# Patient Record
Sex: Female | Born: 1995 | Race: Black or African American | Hispanic: No | Smoking: Never smoker
Health system: Southern US, Community
[De-identification: ages and names within clinical notes are randomized; demographics above are authoritative.]

## PROBLEM LIST (undated history)

## (undated) DIAGNOSIS — Z973 Presence of spectacles and contact lenses: Secondary | ICD-10-CM

## (undated) DIAGNOSIS — J45909 Unspecified asthma, uncomplicated: Secondary | ICD-10-CM

## (undated) DIAGNOSIS — N840 Polyp of corpus uteri: Secondary | ICD-10-CM

---
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
## 2016-03-09 ENCOUNTER — Encounter (HOSPITAL_COMMUNITY): Payer: Self-pay | Admitting: Pharmacy Technician

## 2016-03-09 ENCOUNTER — Observation Stay (HOSPITAL_COMMUNITY)
Admission: EM | Admit: 2016-03-09 | Discharge: 2016-03-12 | Disposition: A | Discharge status: Home / Self Care | Payer: Federal, State, Local not specified - PPO | Attending: General Surgery | Admitting: General Surgery

## 2016-03-09 ENCOUNTER — Emergency Department (HOSPITAL_COMMUNITY): Payer: Federal, State, Local not specified - PPO

## 2016-03-09 DIAGNOSIS — Z882 Allergy status to sulfonamides status: Secondary | ICD-10-CM | POA: Diagnosis not present

## 2016-03-09 DIAGNOSIS — J45909 Unspecified asthma, uncomplicated: Secondary | ICD-10-CM | POA: Diagnosis not present

## 2016-03-09 DIAGNOSIS — K3580 Unspecified acute appendicitis: Secondary | ICD-10-CM | POA: Diagnosis present

## 2016-03-09 DIAGNOSIS — R109 Unspecified abdominal pain: Secondary | ICD-10-CM

## 2016-03-09 HISTORY — DX: Unspecified asthma, uncomplicated: J45.909

## 2016-03-09 LAB — CBC
HEMATOCRIT: 35.7 % — AB (ref 36.0–46.0)
Hemoglobin: 11.9 g/dL — ABNORMAL LOW (ref 12.0–15.0)
MCH: 26.3 pg (ref 26.0–34.0)
MCHC: 33.3 g/dL (ref 30.0–36.0)
MCV: 79 fL (ref 78.0–100.0)
Platelets: 407 10*3/uL — ABNORMAL HIGH (ref 150–400)
RBC: 4.52 MIL/uL (ref 3.87–5.11)
RDW: 15 % (ref 11.5–15.5)
WBC: 16 10*3/uL — ABNORMAL HIGH (ref 4.0–10.5)

## 2016-03-09 LAB — DIFFERENTIAL
BASOS ABS: 0 10*3/uL (ref 0.0–0.1)
BASOS PCT: 0 %
Eosinophils Absolute: 0 10*3/uL (ref 0.0–0.7)
Eosinophils Relative: 0 %
Lymphocytes Relative: 10 %
Lymphs Abs: 1.6 10*3/uL (ref 0.7–4.0)
MONO ABS: 0.9 10*3/uL (ref 0.1–1.0)
Monocytes Relative: 6 %
NEUTROS ABS: 13.3 10*3/uL — AB (ref 1.7–7.7)
NEUTROS PCT: 84 %

## 2016-03-09 LAB — COMPREHENSIVE METABOLIC PANEL
ALBUMIN: 3.4 g/dL — AB (ref 3.5–5.0)
ALT: 12 U/L — AB (ref 14–54)
AST: 24 U/L (ref 15–41)
Alkaline Phosphatase: 55 U/L (ref 38–126)
Anion gap: 8 (ref 5–15)
BUN: 5 mg/dL — AB (ref 6–20)
CHLORIDE: 105 mmol/L (ref 101–111)
CO2: 23 mmol/L (ref 22–32)
Calcium: 9.4 mg/dL (ref 8.9–10.3)
Creatinine, Ser: 1.1 mg/dL — ABNORMAL HIGH (ref 0.44–1.00)
GFR calc Af Amer: >60 mL/min (ref >60)
GFR calc non Af Amer: >60 mL/min (ref >60)
GLUCOSE: 94 mg/dL (ref 65–99)
POTASSIUM: 4.3 mmol/L (ref 3.5–5.1)
Sodium: 136 mmol/L (ref 135–145)
Total Bilirubin: 0.3 mg/dL (ref 0.3–1.2)
Total Protein: 7.9 g/dL (ref 6.5–8.1)

## 2016-03-09 LAB — I-STAT BETA HCG BLOOD, ED (MC, WL, AP ONLY): I-stat hCG, quantitative: <5 m[IU]/mL (ref <5)

## 2016-03-09 LAB — WET PREP, GENITAL
Clue Cells Wet Prep HPF POC: NONE SEEN
SPERM: NONE SEEN
Trich, Wet Prep: NONE SEEN
Yeast Wet Prep HPF POC: NONE SEEN

## 2016-03-09 LAB — LIPASE, BLOOD: LIPASE: 32 U/L (ref 11–51)

## 2016-03-09 MED ORDER — ONDANSETRON 4 MG PO TBDP
ORAL_TABLET | ORAL | Status: AC
  Filled 2016-03-09: qty 1

## 2016-03-09 MED ORDER — AZITHROMYCIN 250 MG PO TABS
1000.0000 mg | ORAL_TABLET | Freq: Once | ORAL | Status: AC
  Administered 2016-03-10: 1000 mg via ORAL
  Filled 2016-03-09: qty 4

## 2016-03-09 MED ORDER — SODIUM CHLORIDE 0.9 % IV BOLUS (SEPSIS)
1000.0000 mL | Freq: Once | INTRAVENOUS | Status: AC
  Administered 2016-03-09: 1000 mL via INTRAVENOUS

## 2016-03-09 MED ORDER — CEFTRIAXONE SODIUM 250 MG IJ SOLR
250.0000 mg | Freq: Once | INTRAMUSCULAR | Status: AC
  Administered 2016-03-10: 250 mg via INTRAMUSCULAR
  Filled 2016-03-09: qty 250

## 2016-03-09 MED ORDER — IOPAMIDOL (ISOVUE-300) INJECTION 61%
INTRAVENOUS | Status: AC
  Administered 2016-03-09: 100 mL
  Filled 2016-03-09: qty 100

## 2016-03-09 MED ORDER — ONDANSETRON 4 MG PO TBDP
4.0000 mg | ORAL_TABLET | Freq: Once | ORAL | Status: AC | PRN
  Administered 2016-03-09: 4 mg via ORAL

## 2016-03-09 NOTE — H&P (Signed)
Robin Floyd is an 20 y.o. female.   Chief Complaint: Right lower quadrant abdominal pain, nausea and vomiting HPI: Robin Floyd developed central abdominal pain 2 days ago. It persisted and got worse by earlier today. She had an episode of vomiting and the pain more localized to her right lower quadrant. She was evaluated in the emergency department where workup revealed leukocytosis of 16,000. CT scan abdomen and pelvis demonstrates acute appendicitis. Additionally, the emergency department is treating her for GC/chlamydia.   Past Medical History:  Diagnosis Date  . Asthma     History reviewed. No pertinent surgical history.  History reviewed. No pertinent family history. Social History:  reports that she has never smoked. She has never used smokeless tobacco. She reports that she does not drink alcohol or use drugs.  Allergies:  Allergies  Allergen Reactions  . Sulfa Antibiotics Hives     (Not in a hospital admission)  Results for orders placed or performed during the hospital encounter of 03/09/16 (from the past 48 hour(s))  Lipase, blood     Status: None   Collection Time: 03/09/16  4:09 PM  Result Value Ref Range   Lipase 32 11 - 51 U/L  Comprehensive metabolic panel     Status: Abnormal   Collection Time: 03/09/16  4:09 PM  Result Value Ref Range   Sodium 136 135 - 145 mmol/L   Potassium 4.3 3.5 - 5.1 mmol/L   Chloride 105 101 - 111 mmol/L   CO2 23 22 - 32 mmol/L   Glucose, Bld 94 65 - 99 mg/dL   BUN 5 (L) 6 - 20 mg/dL   Creatinine, Ser 1.10 (H) 0.44 - 1.00 mg/dL   Calcium 9.4 8.9 - 10.3 mg/dL   Total Protein 7.9 6.5 - 8.1 g/dL   Albumin 3.4 (L) 3.5 - 5.0 g/dL   AST 24 15 - 41 U/L   ALT 12 (L) 14 - 54 U/L   Alkaline Phosphatase 55 38 - 126 U/L   Total Bilirubin 0.3 0.3 - 1.2 mg/dL   GFR calc non Af Amer >60 >60 mL/min   GFR calc Af Amer >60 >60 mL/min    Comment: (NOTE) The eGFR has been calculated using the CKD EPI equation. This calculation has not been validated in  all clinical situations. eGFR's persistently <60 mL/min signify possible Chronic Kidney Disease.    Anion gap 8 5 - 15  CBC     Status: Abnormal   Collection Time: 03/09/16  4:09 PM  Result Value Ref Range   WBC 16.0 (H) 4.0 - 10.5 K/uL   RBC 4.52 3.87 - 5.11 MIL/uL   Hemoglobin 11.9 (L) 12.0 - 15.0 g/dL   HCT 35.7 (L) 36.0 - 46.0 %   MCV 79.0 78.0 - 100.0 fL   MCH 26.3 26.0 - 34.0 pg   MCHC 33.3 30.0 - 36.0 g/dL   RDW 15.0 11.5 - 15.5 %   Platelets 407 (H) 150 - 400 K/uL  Differential     Status: Abnormal   Collection Time: 03/09/16  4:09 PM  Result Value Ref Range   Neutrophils Relative % 84 %   Neutro Abs 13.3 (H) 1.7 - 7.7 K/uL   Lymphocytes Relative 10 %   Lymphs Abs 1.6 0.7 - 4.0 K/uL   Monocytes Relative 6 %   Monocytes Absolute 0.9 0.1 - 1.0 K/uL   Eosinophils Relative 0 %   Eosinophils Absolute 0.0 0.0 - 0.7 K/uL   Basophils Relative 0 %  Basophils Absolute 0.0 0.0 - 0.1 K/uL  I-Stat beta hCG blood, ED     Status: None   Collection Time: 03/09/16  4:31 PM  Result Value Ref Range   I-stat hCG, quantitative <5.0 <5 mIU/mL   Comment 3            Comment:   GEST. AGE      CONC.  (mIU/mL)   <=1 WEEK        5 - 50     2 WEEKS       50 - 500     3 WEEKS       100 - 10,000     4 WEEKS     1,000 - 30,000        FEMALE AND NON-PREGNANT FEMALE:     LESS THAN 5 mIU/mL   Wet prep, genital     Status: Abnormal   Collection Time: 03/09/16  6:55 PM  Result Value Ref Range   Yeast Wet Prep HPF POC NONE SEEN NONE SEEN    Comment: Specimen diluted due to transport tube containing more than 1 ml of saline, interpret results with caution.   Trich, Wet Prep NONE SEEN NONE SEEN   Clue Cells Wet Prep HPF POC NONE SEEN NONE SEEN   WBC, Wet Prep HPF POC MANY (A) NONE SEEN   Sperm NONE SEEN    Ct Abdomen Pelvis W Contrast  Result Date: 03/09/2016 CLINICAL DATA:  Severe right lower quadrant pain EXAM: CT ABDOMEN AND PELVIS WITH CONTRAST TECHNIQUE: Multidetector CT imaging of the  abdomen and pelvis was performed using the standard protocol following bolus administration of intravenous contrast. CONTRAST:  100 cc Isovue 300 intravenous COMPARISON:  None. FINDINGS: Lower chest: Visualized lung bases show no acute consolidation or pleural effusion. Hepatobiliary: No focal liver abnormality is seen. No gallstones, gallbladder wall thickening, or biliary dilatation. Pancreas: Unremarkable. No pancreatic ductal dilatation or surrounding inflammatory changes. Spleen: Normal in size without focal abnormality. Adrenals/Urinary Tract: Adrenal glands are unremarkable. Kidneys are normal, without renal calculi, focal lesion, or hydronephrosis. Bladder is unremarkable. Stomach/Bowel: Stomach is nonenlarged. No dilated small bowel to suggest bowel obstruction. Best seen on coronal views is a dilated tubular structure in the right lower quadrant measuring up to 1 cm in diameter, this is suspected to represent enlarged appendix. No perforation. No abscess. Small fluid in the right lower quadrant. Vascular/Lymphatic: No significant vascular findings are present. No enlarged abdominal or pelvic lymph nodes. Reproductive: Mildly prominent endometrial stripe. No adnexal masses. Other: Small free fluid in the pelvis. No free air. Edema within the pelvic fat. Musculoskeletal: No acute or significant osseous findings. IMPRESSION: 1. Findings are suspicious for acute appendicitis. There is no evidence for perforation or abscess. 2. Small free fluid in the pelvis. Electronically Signed   By: Donavan Foil M.D.   On: 03/09/2016 21:55    Review of Systems  Constitutional: Positive for malaise/fatigue. Negative for fever.  HENT: Negative.   Eyes: Negative.   Respiratory: Negative for cough and shortness of breath.   Cardiovascular: Negative for chest pain.  Gastrointestinal: Positive for abdominal pain and nausea.  Genitourinary: Negative.   Musculoskeletal: Negative.   Skin: Negative.   Neurological:  Negative.   Endo/Heme/Allergies: Negative.   Psychiatric/Behavioral: Negative.     Blood pressure 129/91, pulse 87, temperature 97.7 F (36.5 C), temperature source Oral, resp. rate 14, height '5\' 6"'  (1.676 m), weight 59 kg (130 lb), last menstrual period 02/07/2016, SpO2 100 %.  Physical Exam  Constitutional: She is oriented to person, place, and time. She appears well-developed and well-nourished.  HENT:  Head: Normocephalic.  Right Ear: External ear normal.  Left Ear: External ear normal.  Mouth/Throat: Oropharynx is clear and moist.  Eyes: Conjunctivae and EOM are normal. Pupils are equal, round, and reactive to light.  Neck: Neck supple. No thyromegaly present.  Cardiovascular: Normal rate, normal heart sounds and intact distal pulses.   Respiratory: Effort normal and breath sounds normal. No stridor. No respiratory distress. She has no wheezes. She has no rales.  GI: She exhibits no distension. There is tenderness. There is no rebound and no guarding.  Musculoskeletal: Normal range of motion. She exhibits no edema.  Neurological: She is alert and oriented to person, place, and time. She exhibits normal muscle tone.  Skin: Skin is warm.  Psychiatric: She has a normal mood and affect.     Assessment/Plan Acute appendicitis - admit, IV antibiotics, plan laparoscopic appendectomy. I discussed the procedure, risks, and benefits with her. She wants to hold off on surgery until her family arrives from out of town. They will be here in the morning. I also spoke with her mother on the telephone per her request. I discussed the expected postoperative course and answered their questions. I will discuss with Dr. Brantley Stage in the morning.  GC/Chlamydia infection - this is being treated in the emergency department.  Zenovia Jarred, MD 03/09/2016, 11:41 PM

## 2016-03-09 NOTE — ED Provider Notes (Signed)
[facility] DEPT Provider Note   CSN: [id number] Arrival date & time: 03/09/16  1602     History   Chief Complaint Chief Complaint  Patient presents with  . Vaginal Discharge  . Abdominal Pain    HPI Robin Floyd is a 20 y.o. female.  HPI   Patient is a 20 year old female with no significant past medical history who presents to the emergency department with abdominal pain for 2 days. Pain is constant, sharp/cramping in nature, nonradiating, began around her bellybutton and has moved into her right lower quadrant just today. Pt has not taken anything for this. Associated diarrhea, nausea, vomiting, chills. Patient denies fever, dizziness, syncope, recent illness, hematochezia, hematemesis.  Patient is also complaining of a foul vaginal odor after intercourse. Patient denies vaginal discharge. Seen by her OB/GYN in July for BV and she feels symptoms are similar. Patient denies abnormal vaginal bleeding. Patient has sex with just men, one partner. She has unprotected sex. No history of STDs.  Past Medical History:  Diagnosis Date  . Asthma     Patient Active Problem List   Diagnosis Date Noted  . Acute appendicitis 03/09/2016    History reviewed. No pertinent surgical history.  OB History    No data available       Home Medications    Prior to Admission medications   Medication Sig Start Date End Date Taking? Authorizing Provider  levonorgestrel-ethinyl estradiol (JOLESSA) 0.15-0.03 MG tablet Take 1 tablet by mouth daily.   Yes Historical Provider, MD    Family History History reviewed. No pertinent family history.  Social History Social History  Substance Use Topics  . Smoking status: Never Smoker  . Smokeless tobacco: Never Used  . Alcohol use No     Allergies   Sulfa antibiotics   Review of Systems Review of Systems  Constitutional: Positive for fever.  Cardiovascular: Negative for chest pain.  Gastrointestinal: Positive for abdominal pain,  diarrhea, nausea and vomiting. Negative for abdominal distention, blood in stool and constipation.  Genitourinary: Negative for dysuria, hematuria and vaginal pain.       Vaginal odor  Skin: Negative for rash.  Neurological: Negative for dizziness, syncope, weakness and headaches.  All other systems reviewed and are negative.    Physical Exam Updated Vital Signs BP 129/91 (BP Location: Left Arm)   Pulse 87   Temp 97.7 F (36.5 C) (Oral)   Resp 14   Ht 5\' 6"  (1.676 m)   Wt 59 kg   LMP 02/07/2016 (Approximate)   SpO2 100%   BMI 20.98 kg/m   Physical Exam  Constitutional: She appears well-developed and well-nourished. No distress.  HENT:  Head: Normocephalic and atraumatic.  Eyes: Conjunctivae are normal.  Neck: Normal range of motion.  Cardiovascular: Normal rate, regular rhythm, normal heart sounds and intact distal pulses.  Exam reveals no gallop and no friction rub.   No murmur heard. Pulses:      Radial pulses are 2+ on the right side, and 2+ on the left side.       Dorsalis pedis pulses are 2+ on the right side, and 2+ on the left side.  Pulmonary/Chest: Effort normal and breath sounds normal. No respiratory distress. She has no decreased breath sounds. She has no wheezes. She has no rhonchi. She has no rales. She exhibits no tenderness.  Abdominal: Soft. Normal appearance and bowel sounds are normal. There is tenderness in the right lower quadrant and suprapubic area. There is tenderness at McBurney's point. There  is no rigidity, no rebound, no guarding, no CVA tenderness and negative Murphy's sign.  Genitourinary:  Genitourinary Comments: Exam performed by Jerre SimonJessica L Focht,  exam chaperoned Date: 03/09/2016 Pelvic exam: normal external genitalia without evidence of trauma. VULVA: normal appearing vulva with no masses, tenderness or lesion. VAGINA: normal appearing vagina with normal color and discharge, no lesions. CERVIX: mild cervical motion tenderness absent, cervical  os closed with purulent discharge , os erythematous and friable appearing; vaginal discharge - copious, creamy, mucoid and thick, Wet prep and DNA probe for chlamydia and GC obtained.   ADNEXA: normal adnexa in size, nontender and no masses UTERUS: uterus is normal size, shape, consistency and nontender.    Musculoskeletal: Normal range of motion.  Neurological: She is alert. Coordination normal.  Skin: Skin is warm and dry. She is not diaphoretic.  Psychiatric: She has a normal mood and affect. Her behavior is normal.  Nursing note and vitals reviewed.    ED Treatments / Results  Labs (all labs ordered are listed, but only abnormal results are displayed) Labs Reviewed  WET PREP, GENITAL - Abnormal; Notable for the following:       Result Value   WBC, Wet Prep HPF POC MANY (*)    All other components within normal limits  COMPREHENSIVE METABOLIC PANEL - Abnormal; Notable for the following:    BUN 5 (*)    Creatinine, Ser 1.10 (*)    Albumin 3.4 (*)    ALT 12 (*)    All other components within normal limits  CBC - Abnormal; Notable for the following:    WBC 16.0 (*)    Hemoglobin 11.9 (*)    HCT 35.7 (*)    Platelets 407 (*)    All other components within normal limits  DIFFERENTIAL - Abnormal; Notable for the following:    Neutro Abs 13.3 (*)    All other components within normal limits  LIPASE, BLOOD  RPR  HIV ANTIBODY (ROUTINE TESTING)  URINALYSIS, ROUTINE W REFLEX MICROSCOPIC (NOT AT [facility])  I-STAT BETA HCG BLOOD, ED ([facility] ONLY)  GC/CHLAMYDIA PROBE AMP ([place]) NOT AT [facility]    EKG  EKG Interpretation None       Radiology Ct Abdomen Pelvis W Contrast  Result Date: 03/09/2016 CLINICAL DATA:  Severe right lower quadrant pain EXAM: CT ABDOMEN AND PELVIS WITH CONTRAST TECHNIQUE: Multidetector CT imaging of the abdomen and pelvis was performed using the standard protocol following bolus administration of intravenous contrast. CONTRAST:  100 cc Isovue 300  intravenous COMPARISON:  None. FINDINGS: Lower chest: Visualized lung bases show no acute consolidation or pleural effusion. Hepatobiliary: No focal liver abnormality is seen. No gallstones, gallbladder wall thickening, or biliary dilatation. Pancreas: Unremarkable. No pancreatic ductal dilatation or surrounding inflammatory changes. Spleen: Normal in size without focal abnormality. Adrenals/Urinary Tract: Adrenal glands are unremarkable. Kidneys are normal, without renal calculi, focal lesion, or hydronephrosis. Bladder is unremarkable. Stomach/Bowel: Stomach is nonenlarged. No dilated small bowel to suggest bowel obstruction. Best seen on coronal views is a dilated tubular structure in the right lower quadrant measuring up to 1 cm in diameter, this is suspected to represent enlarged appendix. No perforation. No abscess. Small fluid in the right lower quadrant. Vascular/Lymphatic: No significant vascular findings are present. No enlarged abdominal or pelvic lymph nodes. Reproductive: Mildly prominent endometrial stripe. No adnexal masses. Other: Small free fluid in the pelvis. No free air. Edema within the pelvic fat. Musculoskeletal: No acute or significant osseous findings. IMPRESSION: 1.  Findings are suspicious for acute appendicitis. There is no evidence for perforation or abscess. 2. Small free fluid in the pelvis. Electronically Signed   By: Jasmine PangKim  Fujinaga M.D.   On: 03/09/2016 21:55    Procedures Procedures (including critical care time)  Medications Ordered in ED Medications  cefTRIAXone (ROCEPHIN) injection 250 mg (not administered)  azithromycin (ZITHROMAX) tablet 1,000 mg (not administered)  lidocaine (PF) (XYLOCAINE) 1 % injection (not administered)  ondansetron (ZOFRAN-ODT) disintegrating tablet 4 mg (4 mg Oral Given 03/09/16 1616)  sodium chloride 0.9 % bolus 1,000 mL (0 mLs Intravenous Stopped 03/09/16 2130)  iopamidol (ISOVUE-300) 61 % injection (100 mLs  Contrast Given 03/09/16 2119)    sodium chloride 0.9 % bolus 1,000 mL (1,000 mLs Intravenous New Bag/Given 03/09/16 2328)     Initial Impression / Assessment and Plan / ED Course  I have reviewed the triage vital signs and the nursing notes.  Pertinent labs & imaging results that were available during my care of the patient were reviewed by me and considered in my medical decision making (see chart for details).  Clinical Course    Patient treated in the ED for STI with Azithromycin and Rocephin. Patient advised to inform and treat all sexual partners.  Pt advised on safe sex practices and understands that they have GC/Chlamydia cultures pending and will result in 2-3 days. HIV and RPR sent. Pt encouraged to follow up at local health department for future STI checks.   Patient also found to have leukocytosis. PT with appendicitis on CT abdomen. The Gen. surgery team was consulted and patient will be admitted for appendectomy.   Thank you Dr. Janee Mornhompson for your consult, time and care of this patient.   Pt case discussed with Dr. Eudelia Bunchardama who agrees with the above plan.    Final Clinical Impressions(s) / ED Diagnoses   Final diagnoses:  Abdominal pain, unspecified abdominal location  Acute appendicitis, unspecified acute appendicitis type    New Prescriptions New Prescriptions   No medications on file     Jerre SimonJessica L Focht, PA 03/10/16 0022    Nira ConnPedro Eduardo Cardama, MD 03/10/16 [phone]

## 2016-03-09 NOTE — ED Triage Notes (Signed)
Pt reports to the ED with reports of vaginal odor since Friday when she last had intercourse. Pt also c/o N/V/D X 2 days. Reports vomiting X1 in the last 24 hours.

## 2016-03-10 ENCOUNTER — Observation Stay (HOSPITAL_COMMUNITY): Payer: Federal, State, Local not specified - PPO | Admitting: Anesthesiology

## 2016-03-10 ENCOUNTER — Encounter (HOSPITAL_COMMUNITY)
Admission: EM | Disposition: A | Discharge status: Home / Self Care | Payer: Self-pay | Source: Home / Self Care | Attending: Emergency Medicine

## 2016-03-10 HISTORY — PX: LAPAROSCOPIC APPENDECTOMY: SHX408

## 2016-03-10 LAB — URINALYSIS, ROUTINE W REFLEX MICROSCOPIC
Bilirubin Urine: NEGATIVE
Glucose, UA: NEGATIVE mg/dL
Hgb urine dipstick: NEGATIVE
KETONES UR: 15 mg/dL — AB
LEUKOCYTES UA: NEGATIVE
NITRITE: NEGATIVE
PH: 5.5 (ref 5.0–8.0)
PROTEIN: NEGATIVE mg/dL
Specific Gravity, Urine: 1.017 (ref 1.005–1.030)

## 2016-03-10 LAB — GC/CHLAMYDIA PROBE AMP (~~LOC~~) NOT AT ARMC
Chlamydia: POSITIVE — AB
Neisseria Gonorrhea: NEGATIVE

## 2016-03-10 LAB — SURGICAL PCR SCREEN
MRSA, PCR: NEGATIVE
STAPHYLOCOCCUS AUREUS: NEGATIVE

## 2016-03-10 LAB — HIV ANTIBODY (ROUTINE TESTING W REFLEX): HIV Screen 4th Generation wRfx: NONREACTIVE

## 2016-03-10 LAB — RPR: RPR: NONREACTIVE

## 2016-03-10 SURGERY — APPENDECTOMY, LAPAROSCOPIC
Anesthesia: General | Site: Abdomen

## 2016-03-10 MED ORDER — PROPOFOL 10 MG/ML IV BOLUS
INTRAVENOUS | Status: AC
  Filled 2016-03-10: qty 20

## 2016-03-10 MED ORDER — MORPHINE SULFATE (PF) 2 MG/ML IV SOLN
2.0000 mg | INTRAVENOUS | Status: DC | PRN
  Administered 2016-03-10 (×2): 2 mg via INTRAVENOUS
  Administered 2016-03-10: 4 mg via INTRAVENOUS
  Administered 2016-03-10: 2 mg via INTRAVENOUS
  Administered 2016-03-10 – 2016-03-11 (×3): 4 mg via INTRAVENOUS
  Filled 2016-03-10: qty 1
  Filled 2016-03-10 (×2): qty 2
  Filled 2016-03-10: qty 1
  Filled 2016-03-10 (×2): qty 2
  Filled 2016-03-10: qty 1

## 2016-03-10 MED ORDER — PIPERACILLIN-TAZOBACTAM 3.375 G IVPB
3.3750 g | Freq: Three times a day (TID) | INTRAVENOUS | Status: DC
  Administered 2016-03-10 – 2016-03-12 (×7): 3.375 g via INTRAVENOUS
  Filled 2016-03-10 (×9): qty 50

## 2016-03-10 MED ORDER — ONDANSETRON HCL 4 MG/2ML IJ SOLN
INTRAMUSCULAR | Status: DC | PRN
  Administered 2016-03-10: 4 mg via INTRAVENOUS

## 2016-03-10 MED ORDER — MIDAZOLAM HCL 2 MG/2ML IJ SOLN
INTRAMUSCULAR | Status: AC
  Filled 2016-03-10: qty 2

## 2016-03-10 MED ORDER — MIDAZOLAM HCL 5 MG/5ML IJ SOLN
INTRAMUSCULAR | Status: DC | PRN
  Administered 2016-03-10: 2 mg via INTRAVENOUS

## 2016-03-10 MED ORDER — ONDANSETRON HCL 4 MG/2ML IJ SOLN
4.0000 mg | Freq: Four times a day (QID) | INTRAMUSCULAR | Status: DC | PRN
  Filled 2016-03-10: qty 2

## 2016-03-10 MED ORDER — BUPIVACAINE HCL 0.25 % IJ SOLN
INTRAMUSCULAR | Status: DC | PRN
  Administered 2016-03-10: 4 mL

## 2016-03-10 MED ORDER — PROPOFOL 10 MG/ML IV BOLUS
INTRAVENOUS | Status: DC | PRN
  Administered 2016-03-10: 150 mg via INTRAVENOUS

## 2016-03-10 MED ORDER — FENTANYL CITRATE (PF) 100 MCG/2ML IJ SOLN: 25.0000 ug | INTRAMUSCULAR | Status: DC | PRN

## 2016-03-10 MED ORDER — FENTANYL CITRATE (PF) 100 MCG/2ML IJ SOLN
25.0000 ug | INTRAMUSCULAR | Status: AC | PRN
  Administered 2016-03-10 (×3): 50 ug via INTRAVENOUS

## 2016-03-10 MED ORDER — ONDANSETRON 4 MG PO TBDP: 4.0000 mg | ORAL_TABLET | Freq: Four times a day (QID) | ORAL | Status: DC | PRN

## 2016-03-10 MED ORDER — LACTATED RINGERS IV SOLN
INTRAVENOUS | Status: DC | PRN
  Administered 2016-03-10 (×2): via INTRAVENOUS

## 2016-03-10 MED ORDER — ZOLPIDEM TARTRATE 5 MG PO TABS: 5.0000 mg | ORAL_TABLET | Freq: Every evening | ORAL | Status: DC | PRN

## 2016-03-10 MED ORDER — LIDOCAINE HCL (CARDIAC) 20 MG/ML IV SOLN
INTRAVENOUS | Status: DC | PRN
  Administered 2016-03-10: 40 mg via INTRAVENOUS

## 2016-03-10 MED ORDER — DIPHENHYDRAMINE HCL 50 MG/ML IJ SOLN: 25.0000 mg | Freq: Four times a day (QID) | INTRAMUSCULAR | Status: DC | PRN

## 2016-03-10 MED ORDER — FENTANYL CITRATE (PF) 100 MCG/2ML IJ SOLN
INTRAMUSCULAR | Status: AC
  Administered 2016-03-10: 50 ug via INTRAVENOUS
  Filled 2016-03-10: qty 2

## 2016-03-10 MED ORDER — LACTATED RINGERS IV SOLN: INTRAVENOUS | Status: DC

## 2016-03-10 MED ORDER — LEVONORGEST-ETH ESTRAD 91-DAY 0.15-0.03 MG PO TABS
1.0000 | ORAL_TABLET | Freq: Every day | ORAL | Status: DC
  Administered 2016-03-11 – 2016-03-12 (×2): 1 via ORAL

## 2016-03-10 MED ORDER — FENTANYL CITRATE (PF) 100 MCG/2ML IJ SOLN
INTRAMUSCULAR | Status: AC
  Filled 2016-03-10: qty 2

## 2016-03-10 MED ORDER — GLYCOPYRROLATE 0.2 MG/ML IJ SOLN
INTRAMUSCULAR | Status: DC | PRN
  Administered 2016-03-10: .6 mg via INTRAVENOUS

## 2016-03-10 MED ORDER — 0.9 % SODIUM CHLORIDE (POUR BTL) OPTIME
TOPICAL | Status: DC | PRN
  Administered 2016-03-10: 1000 mL

## 2016-03-10 MED ORDER — SODIUM CHLORIDE 0.9 % IR SOLN
Status: DC | PRN
  Administered 2016-03-10: 1

## 2016-03-10 MED ORDER — ONDANSETRON HCL 4 MG/2ML IJ SOLN: 4.0000 mg | Freq: Once | INTRAMUSCULAR | Status: DC | PRN

## 2016-03-10 MED ORDER — KCL IN DEXTROSE-NACL 20-5-0.45 MEQ/L-%-% IV SOLN
INTRAVENOUS | Status: DC
  Administered 2016-03-10 – 2016-03-11 (×4): via INTRAVENOUS
  Filled 2016-03-10 (×4): qty 1000

## 2016-03-10 MED ORDER — FENTANYL CITRATE (PF) 100 MCG/2ML IJ SOLN
INTRAMUSCULAR | Status: DC | PRN
  Administered 2016-03-10: 50 ug via INTRAVENOUS
  Administered 2016-03-10: 100 ug via INTRAVENOUS
  Administered 2016-03-10: 50 ug via INTRAVENOUS

## 2016-03-10 MED ORDER — OXYCODONE-ACETAMINOPHEN 5-325 MG PO TABS
1.0000 | ORAL_TABLET | ORAL | Status: DC | PRN
  Administered 2016-03-10: 2 via ORAL
  Administered 2016-03-10: 1 via ORAL
  Administered 2016-03-11: 2 via ORAL
  Filled 2016-03-10: qty 1
  Filled 2016-03-10 (×4): qty 2

## 2016-03-10 MED ORDER — NEOSTIGMINE METHYLSULFATE 10 MG/10ML IV SOLN
INTRAVENOUS | Status: DC | PRN
  Administered 2016-03-10: 4 mg via INTRAVENOUS

## 2016-03-10 MED ORDER — OXYCODONE HCL 5 MG PO TABS: 5.0000 mg | ORAL_TABLET | Freq: Once | ORAL | Status: DC | PRN

## 2016-03-10 MED ORDER — HEMOSTATIC AGENTS (NO CHARGE) OPTIME
TOPICAL | Status: DC | PRN
  Administered 2016-03-10: 1 via TOPICAL

## 2016-03-10 MED ORDER — BUPIVACAINE HCL (PF) 0.25 % IJ SOLN
INTRAMUSCULAR | Status: AC
  Filled 2016-03-10: qty 30

## 2016-03-10 MED ORDER — LIDOCAINE HCL (PF) 1 % IJ SOLN
INTRAMUSCULAR | Status: AC
  Administered 2016-03-10: 1 mL
  Filled 2016-03-10: qty 5

## 2016-03-10 MED ORDER — OXYCODONE HCL 5 MG/5ML PO SOLN: 5.0000 mg | Freq: Once | ORAL | Status: DC | PRN

## 2016-03-10 MED ORDER — DIPHENHYDRAMINE HCL 25 MG PO CAPS: 25.0000 mg | ORAL_CAPSULE | Freq: Four times a day (QID) | ORAL | Status: DC | PRN

## 2016-03-10 MED ORDER — ROCURONIUM BROMIDE 100 MG/10ML IV SOLN
INTRAVENOUS | Status: DC | PRN
  Administered 2016-03-10: 40 mg via INTRAVENOUS

## 2016-03-10 SURGICAL SUPPLY — 46 items
APPLIER CLIP ROT 10 11.4 M/L (STAPLE) ×2
BLADE SURG ROTATE 9660 (MISCELLANEOUS) ×2 IMPLANT
CANISTER SUCTION 2500CC (MISCELLANEOUS) ×2 IMPLANT
CHLORAPREP W/TINT 26ML (MISCELLANEOUS) ×2 IMPLANT
CLIP APPLIE ROT 10 11.4 M/L (STAPLE) ×1 IMPLANT
COVER SURGICAL LIGHT HANDLE (MISCELLANEOUS) ×2 IMPLANT
CUTTER FLEX LINEAR 45M (STAPLE) ×2 IMPLANT
DERMABOND ADVANCED (GAUZE/BANDAGES/DRESSINGS) ×1
DERMABOND ADVANCED .7 DNX12 (GAUZE/BANDAGES/DRESSINGS) ×1 IMPLANT
DRAPE WARM FLUID 44X44 (DRAPE) ×2 IMPLANT
ELECT REM PT RETURN 9FT ADLT (ELECTROSURGICAL) ×2
ELECTRODE REM PT RTRN 9FT ADLT (ELECTROSURGICAL) ×1 IMPLANT
ENDOLOOP SUT PDS II  0 18 (SUTURE)
ENDOLOOP SUT PDS II 0 18 (SUTURE) IMPLANT
GLOVE BIO SURGEON STRL SZ8 (GLOVE) ×2 IMPLANT
GLOVE BIOGEL PI IND STRL 7.0 (GLOVE) ×1 IMPLANT
GLOVE BIOGEL PI IND STRL 7.5 (GLOVE) ×1 IMPLANT
GLOVE BIOGEL PI IND STRL 8 (GLOVE) ×1 IMPLANT
GLOVE BIOGEL PI INDICATOR 7.0 (GLOVE) ×1
GLOVE BIOGEL PI INDICATOR 7.5 (GLOVE) ×1
GLOVE BIOGEL PI INDICATOR 8 (GLOVE) ×1
GLOVE SURG SS PI 6.5 STRL IVOR (GLOVE) ×2 IMPLANT
GLOVE SURG SS PI 7.5 STRL IVOR (GLOVE) ×2 IMPLANT
GOWN STRL REUS W/ TWL LRG LVL3 (GOWN DISPOSABLE) ×2 IMPLANT
GOWN STRL REUS W/ TWL XL LVL3 (GOWN DISPOSABLE) ×1 IMPLANT
GOWN STRL REUS W/TWL LRG LVL3 (GOWN DISPOSABLE) ×2
GOWN STRL REUS W/TWL XL LVL3 (GOWN DISPOSABLE) ×1
HEMOSTAT SNOW SURGICEL 2X4 (HEMOSTASIS) ×2 IMPLANT
KIT BASIN OR (CUSTOM PROCEDURE TRAY) ×2 IMPLANT
KIT ROOM TURNOVER OR (KITS) ×2 IMPLANT
NS IRRIG 1000ML POUR BTL (IV SOLUTION) ×2 IMPLANT
PAD ARMBOARD 7.5X6 YLW CONV (MISCELLANEOUS) ×4 IMPLANT
POUCH SPECIMEN RETRIEVAL 10MM (ENDOMECHANICALS) ×2 IMPLANT
RELOAD STAPLE TA45 3.5 REG BLU (ENDOMECHANICALS) ×2 IMPLANT
SCALPEL HARMONIC ACE (MISCELLANEOUS) ×2 IMPLANT
SCISSORS LAP 5X35 DISP (ENDOMECHANICALS) ×2 IMPLANT
SET IRRIG TUBING LAPAROSCOPIC (IRRIGATION / IRRIGATOR) ×2 IMPLANT
SPECIMEN JAR SMALL (MISCELLANEOUS) ×2 IMPLANT
SUT MON AB 4-0 PC3 18 (SUTURE) ×2 IMPLANT
TOWEL OR 17X24 6PK STRL BLUE (TOWEL DISPOSABLE) ×2 IMPLANT
TOWEL OR 17X26 10 PK STRL BLUE (TOWEL DISPOSABLE) ×2 IMPLANT
TRAY FOLEY CATH 16FR SILVER (SET/KITS/TRAYS/PACK) ×2 IMPLANT
TRAY LAPAROSCOPIC MC (CUSTOM PROCEDURE TRAY) ×2 IMPLANT
TROCAR XCEL BLADELESS 5X75MML (TROCAR) ×4 IMPLANT
TROCAR XCEL BLUNT TIP 100MML (ENDOMECHANICALS) ×2 IMPLANT
TUBING INSUFFLATION (TUBING) ×2 IMPLANT

## 2016-03-10 NOTE — ED Notes (Signed)
Report was given to 6N.  Patient and belongings taken to 6N by EMT.

## 2016-03-10 NOTE — Transfer of Care (Signed)
Immediate Anesthesia Transfer of Care Note  Patient: Robin Floyd  Procedure(s) Performed: Procedure(s): APPENDECTOMY LAPAROSCOPIC (N/A)  Patient Location: PACU  Anesthesia Type:General  Level of Consciousness: awake, alert  and oriented  Airway & Oxygen Therapy: Patient Spontanous Breathing and Patient connected to nasal cannula oxygen  Post-op Assessment: Report given to RN, Post -op Vital signs reviewed and stable and Patient moving all extremities X 4  Post vital signs: Reviewed and stable  Last Vitals:  Vitals:   03/10/16 0523 03/10/16 1452  BP: (!) 104/47   Pulse: 80   Resp: 18   Temp: 36.7 C (P) 36.4 C    Last Pain:  Vitals:   03/10/16 0523  TempSrc: Oral  PainSc:          Complications: No apparent anesthesia complications

## 2016-03-10 NOTE — Anesthesia Procedure Notes (Signed)
Procedure Name: Intubation Date/Time: 03/10/2016 1:37 PM Performed by: Carmela RimaMARTINELLI, Madelon Welsch F Pre-anesthesia Checklist: Timeout performed, Patient being monitored, Suction available, Emergency Drugs available and Patient identified Patient Re-evaluated:Patient Re-evaluated prior to inductionOxygen Delivery Method: Circle system utilized Preoxygenation: Pre-oxygenation with 100% oxygen Intubation Type: IV induction Ventilation: Mask ventilation without difficulty Laryngoscope Size: Mac and 3 Grade View: Grade I Tube type: Oral Tube size: 7.0 mm Number of attempts: 1 Placement Confirmation: breath sounds checked- equal and bilateral,  positive ETCO2 and ETT inserted through vocal cords under direct vision Secured at: 21 cm Tube secured with: Tape Dental Injury: Teeth and Oropharynx as per pre-operative assessment

## 2016-03-10 NOTE — Op Note (Signed)
Appendectomy, Lap, Procedure Note  Indications: The patient presented with a history of right-sided abdominal pain. A CT revealed findings consistent with acute appendicitis. Procedure and treatments discussed with the patient and her mother.  Medical and surgical options discussed. Pt and mother agree to proceed with surgery.   Pre-operative Diagnosis: Acute appendicitis without mention of peritonitis  Post-operative Diagnosis: Same  Surgeon: Sofia Jaquith A.   Assistants: Orson SlickBowman RNFA   Anesthesia: General endotracheal anesthesia and Local anesthesia 0.25.% bupivacaine, with epinephrine  ASA Class: 1  Procedure Details  The patient was seen again in the Holding Room. The risks, benefits, complications, treatment options, and expected outcomes were discussed with the patient and/or family. The possibilities of reaction to medication, pulmonary aspiration, perforation of viscus, bleeding, recurrent infection, finding a normal appendix, the need for additional procedures, failure to diagnose a condition, and creating a complication requiring transfusion or operation were discussed. There was concurrence with the proposed plan and informed consent was obtained. The site of surgery was properly noted/marked. The patient was taken to Operating Room, identified as Robin Floyd and the procedure verified as Appendectomy. A Time Out was held and the above information confirmed.  The patient was placed in the supine position and general anesthesia was induced, along with placement of orogastric tube, Venodyne boots, and a Foley catheter. The abdomen was prepped and draped in a sterile fashion. A one centimeter infraumbilical incision was made and the peritoneal cavity was accessed using the OPEN  technique. The pneumoperitoneum was then established to steady pressure of 12 mmHg. A 12 mm port was placed through the umbilical incision. Additional 5 mm cannulas then placed in the left lower quadrant of the  abdomen and half way between the umbilicus and upper midline  under direct vision. A careful evaluation of the entire abdomen was carried out. The patient was placed in Trendelenburg and left lateral decubitus position. The small intestines were retracted in the cephalad and left lateral direction away from the pelvis and right lower quadrant. The patient was found to have an enlarged and inflamed appendix that was extending into the pelvis. There was no evidence of perforation.  The appendix was carefully dissected. A window was made in the mesoappendix at the base of the appendix. A harmonic scalpel was used across the mesoappendix. The appendix was divided at its base using an endo-GIA stapler. Minimal appendiceal stump was left in place. There was no evidence of bleeding, leakage, or complication after division of the appendix.  Oozing noted along the staple line of the appendix.  This was treated with Surgicel snow and had good control. Irrigation was also performed and irrigate suctioned from the abdomen as well.  The umbilical port site was closed using 0 vicryl pursestring sutures fashion at the level of the fascia. The trocar site skin wounds were closed using skin staples.  Instrument, sponge, and needle counts were correct at the conclusion of the case.   Findings: The appendix was found to be inflamed. There were not signs of necrosis.  There was not perforation. There was not abscess formation.  Estimated Blood Loss:  less than 50 mL         Drains: none         Total IV Fluids: 600 mL         Specimens: appendix          Complications:  None; patient tolerated the procedure well.         Disposition: PACU - hemodynamically  stable.         Condition: stable

## 2016-03-10 NOTE — Progress Notes (Signed)
Day of Surgery  Subjective: Pt wants mother here for surgery She is en route   Objective: Vital signs in last 24 hours: Temp:  [97.7 F (36.5 C)-99.6 F (37.6 C)] 98.1 F (36.7 C) (11/24 0523) Pulse Rate:  [77-87] 80 (11/24 0523) Resp:  [14-18] 18 (11/24 0523) BP: (104-129)/(47-91) 104/47 (11/24 0523) SpO2:  [100 %] 100 % (11/24 0523) Weight:  [59 kg (130 lb)-61.3 kg (135 lb 1.6 oz)] 61.3 kg (135 lb 1.6 oz) (11/24 0047) Last BM Date: 03/09/16  Intake/Output from previous day: 11/23 0701 - 11/24 0700 In: 335 [I.V.:285; IV Piggyback:50] Out: 1000 [Urine:1000] Intake/Output this shift: No intake/output data recorded.  GI: tender RLQ   Lab Results:   Recent Labs  03/09/16 1609  WBC 16.0*  HGB 11.9*  HCT 35.7*  PLT 407*   BMET  Recent Labs  03/09/16 1609  NA 136  K 4.3  CL 105  CO2 23  GLUCOSE 94  BUN 5*  CREATININE 1.10*  CALCIUM 9.4   PT/INR No results for input(s): LABPROT, INR in the last 72 hours. ABG No results for input(s): PHART, HCO3 in the last 72 hours.  Invalid input(s): PCO2, PO2  Studies/Results: Ct Abdomen Pelvis W Contrast  Result Date: 03/09/2016 CLINICAL DATA:  Severe right lower quadrant pain EXAM: CT ABDOMEN AND PELVIS WITH CONTRAST TECHNIQUE: Multidetector CT imaging of the abdomen and pelvis was performed using the standard protocol following bolus administration of intravenous contrast. CONTRAST:  100 cc Isovue 300 intravenous COMPARISON:  None. FINDINGS: Lower chest: Visualized lung bases show no acute consolidation or pleural effusion. Hepatobiliary: No focal liver abnormality is seen. No gallstones, gallbladder wall thickening, or biliary dilatation. Pancreas: Unremarkable. No pancreatic ductal dilatation or surrounding inflammatory changes. Spleen: Normal in size without focal abnormality. Adrenals/Urinary Tract: Adrenal glands are unremarkable. Kidneys are normal, without renal calculi, focal lesion, or hydronephrosis. Bladder is  unremarkable. Stomach/Bowel: Stomach is nonenlarged. No dilated small bowel to suggest bowel obstruction. Best seen on coronal views is a dilated tubular structure in the right lower quadrant measuring up to 1 cm in diameter, this is suspected to represent enlarged appendix. No perforation. No abscess. Small fluid in the right lower quadrant. Vascular/Lymphatic: No significant vascular findings are present. No enlarged abdominal or pelvic lymph nodes. Reproductive: Mildly prominent endometrial stripe. No adnexal masses. Other: Small free fluid in the pelvis. No free air. Edema within the pelvic fat. Musculoskeletal: No acute or significant osseous findings. IMPRESSION: 1. Findings are suspicious for acute appendicitis. There is no evidence for perforation or abscess. 2. Small free fluid in the pelvis. Electronically Signed   By: Jasmine PangKim  Fujinaga M.D.   On: 03/09/2016 21:55    Anti-infectives: Anti-infectives    Start     Dose/Rate Route Frequency Ordered Stop   03/10/16 0200  piperacillin-tazobactam (ZOSYN) IVPB 3.375 g     3.375 g 12.5 mL/hr over 240 Minutes Intravenous Every 8 hours 03/10/16 0038     03/09/16 2345  azithromycin (ZITHROMAX) tablet 1,000 mg     1,000 mg Oral  Once 03/09/16 2342 03/10/16 0014   03/09/16 2300  cefTRIAXone (ROCEPHIN) injection 250 mg     250 mg Intramuscular  Once 03/09/16 2252 03/10/16 0015      Assessment/Plan: s/p Procedure(s): APPENDECTOMY LAPAROSCOPIC (N/A) AWAIT mothers arrival since patient refuses to proceed DISCUSSED WITH MOTHER ON THE PHONE  RISK OF RUPTURE DISCUSSED  Pt adamant about waiting The patient returns after colon resection.  They are doing well.  They  are having good bowel movements and tolerating a regular diet.   The procedure has been discussed with the patient.  Alternative therapies have been discussed with the patient.  Operative risks include bleeding,  Infection,  Organ injury,  Nerve injury,  Blood vessel injury,  DVT,  Pulmonary  embolism,  Death,  And possible reoperation.  Medical management risks include worsening of present situation.  The success of the procedure is 50 -90 % at treating patients symptoms.  The patient understands and agrees to proceed.   LOS: 1 day    Rafeal Skibicki A. 03/10/2016

## 2016-03-10 NOTE — Progress Notes (Addendum)
1244 Pt's mother came. Dr Luisa Hartornett was able to talk to the pt this morning about the surgery. Pt signed the surgical consent.  1300 [place]. Pt to pre-op via bed.  1600 Received pt back from PACU via bed. A&O x4, 3 lap sites to mid abd with skin glue dry and intact. Pt denies pain at this time.

## 2016-03-10 NOTE — Anesthesia Preprocedure Evaluation (Addendum)
Anesthesia Evaluation  Patient identified by MRN, date of birth, ID band Patient awake    Reviewed: Allergy & Precautions, H&P , Patient's Chart, lab work & pertinent test results, reviewed documented beta blocker date and time   Airway Mallampati: II  TM Distance: >3 FB Neck ROM: full    Dental no notable dental hx.    Pulmonary asthma ,    Pulmonary exam normal breath sounds clear to auscultation       Cardiovascular  Rhythm:regular Rate:Normal     Neuro/Psych    GI/Hepatic   Endo/Other    Renal/GU      Musculoskeletal   Abdominal   Peds  Hematology   Anesthesia Other Findings No inhaler  Reproductive/Obstetrics                            Anesthesia Physical Anesthesia Plan  ASA: II  Anesthesia Plan: General   Post-op Pain Management:    Induction: Intravenous and Cricoid pressure planned  Airway Management Planned: Oral ETT  Additional Equipment:   Intra-op Plan:   Post-operative Plan: Extubation in OR  Informed Consent: I have reviewed the patients History and Physical, chart, labs and discussed the procedure including the risks, benefits and alternatives for the proposed anesthesia with the patient or authorized representative who has indicated his/her understanding and acceptance.   Dental Advisory Given and Dental advisory given  Plan Discussed with: CRNA and Surgeon  Anesthesia Plan Comments: (  Discussed general anesthesia, including possible nausea, instrumentation of airway, sore throat,pulmonary aspiration, etc. I asked if the were any outstanding questions, or  concerns before we proceeded. )        Anesthesia Quick Evaluation

## 2016-03-10 NOTE — Anesthesia Postprocedure Evaluation (Signed)
Anesthesia Post Note  Patient: Robin Floyd  Procedure(s) Performed: Procedure(s) (LRB): APPENDECTOMY LAPAROSCOPIC (N/A)  Patient location during evaluation: PACU Anesthesia Type: General Level of consciousness: awake, awake and alert and oriented Pain management: pain level controlled Vital Signs Assessment: post-procedure vital signs reviewed and stable Respiratory status: spontaneous breathing, nonlabored ventilation and respiratory function stable Cardiovascular status: blood pressure returned to baseline Anesthetic complications: no    Last Vitals:  Vitals:   03/10/16 1522 03/10/16 1621  BP: 134/87 128/72  Pulse: 76 72  Resp: 18   Temp:  37.1 C    Last Pain:  Vitals:   03/10/16 1621  TempSrc: Oral  PainSc:                  Eryk Beavers COKER

## 2016-03-11 ENCOUNTER — Encounter (HOSPITAL_COMMUNITY): Payer: Self-pay | Admitting: Surgery

## 2016-03-11 MED ORDER — TRAMADOL HCL 50 MG PO TABS
50.0000 mg | ORAL_TABLET | Freq: Four times a day (QID) | ORAL | Status: DC
  Administered 2016-03-11 – 2016-03-12 (×4): 50 mg via ORAL
  Filled 2016-03-11 (×4): qty 1

## 2016-03-11 MED ORDER — OXYCODONE-ACETAMINOPHEN 5-325 MG PO TABS: 1.0000 | ORAL_TABLET | ORAL | 0 refills | Status: DC | PRN

## 2016-03-11 MED ORDER — MORPHINE SULFATE (PF) 2 MG/ML IV SOLN: 2.0000 mg | INTRAVENOUS | Status: DC | PRN

## 2016-03-11 NOTE — Discharge Instructions (Signed)
Please arrive at least 30 min before your appointment to complete your check in paperwork.  If you are unable to arrive 30 min prior to your appointment time we may have to cancel or reschedule you. ° °LAPAROSCOPIC SURGERY: POST OP INSTRUCTIONS  °1. DIET: Follow a light bland diet the first 24 hours after arrival home, such as soup, liquids, crackers, etc. Be sure to include lots of fluids daily. Avoid fast food or heavy meals as your are more likely to get nauseated. Eat a low fat the next few days after surgery.  °2. Take your usually prescribed home medications unless otherwise directed. °3. PAIN CONTROL:  °1. Pain is best controlled by a usual combination of three different methods TOGETHER:  °1. Ice/Heat °2. Over the counter pain medication °3. Prescription pain medication °2. Most patients will experience some swelling and bruising around the incisions. Ice packs or heating pads (30-60 minutes up to 6 times a day) will help. Use ice for the first few days to help decrease swelling and bruising, then switch to heat to help relax tight/sore spots and speed recovery. Some people prefer to use ice alone, heat alone, alternating between ice & heat. Experiment to what works for you. Swelling and bruising can take several weeks to resolve.  °3. It is helpful to take an over-the-counter pain medication regularly for the first few weeks. Choose one of the following that works best for you:  °1. Naproxen (Aleve, etc) Two 220mg tabs twice a day °2. Ibuprofen (Advil, etc) Three 200mg tabs four times a day (every meal & bedtime) °3. Acetaminophen (Tylenol, etc) 500-650mg four times a day (every meal & bedtime) °4. A prescription for pain medication (such as oxycodone, hydrocodone, etc) should be given to you upon discharge. Take your pain medication as prescribed.  °1. If you are having problems/concerns with the prescription medicine (does not control pain, nausea, vomiting, rash, itching, etc), please call us [phone]  [phone] to see if we need to switch you to a different pain medicine that will work better for you and/or control your side effect better. °2. If you need a refill on your pain medication, please contact your pharmacy. They will contact our office to request authorization. Prescriptions will not be filled after 5 pm or on week-ends. °4. Avoid getting constipated. Between the surgery and the pain medications, it is common to experience some constipation. Increasing fluid intake and taking a fiber supplement (such as Metamucil, Citrucel, FiberCon, MiraLax, etc) 1-2 times a day regularly will usually help prevent this problem from occurring. A mild laxative (prune juice, Milk of Magnesia, MiraLax, etc) should be taken according to package directions if there are no bowel movements after 48 hours.  °5. Watch out for diarrhea. If you have many loose bowel movements, simplify your diet to bland foods & liquids for a few days. Stop any stool softeners and decrease your fiber supplement. Switching to mild anti-diarrheal medications (Kayopectate, Pepto Bismol) can help. If this worsens or does not improve, please call us. °6. Wash / shower every day. You may shower over the dressings as they are waterproof. Continue to shower over incision(s) after the dressing is off. °7. Remove your waterproof bandages 5 days after surgery. You may leave the incision open to air. You may replace a dressing/Band-Aid to cover the incision for comfort if you wish.  °8. ACTIVITIES as tolerated:  °1. You may resume regular (light) daily activities beginning the next day--such as daily self-care, walking, climbing stairs--gradually   increasing activities as tolerated. If you can walk 30 minutes without difficulty, it is safe to try more intense activity such as jogging, treadmill, bicycling, low-impact aerobics, swimming, etc. °2. Save the most intensive and strenuous activity for last such as sit-ups, heavy lifting, contact sports, etc Refrain  from any heavy lifting or straining until you are off narcotics for pain control.  °3. DO NOT PUSH THROUGH PAIN. Let pain be your guide: If it hurts to do something, don't do it. Pain is your body warning you to avoid that activity for another week until the pain goes down. °4. You may drive when you are no longer taking prescription pain medication, you can comfortably wear a seatbelt, and you can safely maneuver your car and apply brakes. °5. You may have sexual intercourse when it is comfortable.  °9. FOLLOW UP in our office  °1. Please call [facility] at [phone] to set up an appointment to see your surgeon in the office for a follow-up appointment approximately 2-3 weeks after your surgery. °2. Make sure that you call for this appointment the day you arrive home to insure a convenient appointment time. °     10. IF YOU HAVE DISABILITY OR FAMILY LEAVE FORMS, BRING THEM TO THE               OFFICE FOR PROCESSING.  ° °WHEN TO CALL US [phone]:  °1. Poor pain control °2. Reactions / problems with new medications (rash/itching, nausea, etc)  °3. Fever over 101.5 F (38.5 C) °4. Inability to urinate °5. Nausea and/or vomiting °6. Worsening swelling or bruising °7. Continued bleeding from incision. °8. Increased pain, redness, or drainage from the incision ° °The clinic staff is available to answer your questions during regular business hours (8:30am-5pm). Please don’t hesitate to call and ask to speak to one of our nurses for clinical concerns.  °If you have a medical emergency, go to the nearest emergency room or call 911.  °A surgeon from [facility] is always on call at the hospitals  ° °[facility]  °[address], [address], [place], [place] [address] ?  °MAIN: [phone] ? TOLL FREE: [phone] ?  °FAX [phone]  °[email or web address] ° °

## 2016-03-11 NOTE — Progress Notes (Signed)
[facility] Progress Note  1 Day Post-Op  Subjective: C/o abdominal soreness. Percocet "not working" and patient getting 4 mg morphine q 4h. Pt has had sips of liquids from the floor but has not eaten food since surgery. Ambulating to bathroom. Denies flatus but "stomach rumbling". No incentive spirometer at bedside.  Objective: Vital signs in last 24 hours: Temp:  [97.6 F (36.4 C)-98.9 F (37.2 C)] 98.8 F (37.1 C) (11/25 0539) Pulse Rate:  [72-106] 75 (11/25 0539) Resp:  [14-18] 17 (11/25 0539) BP: (108-134)/(57-87) 108/57 (11/25 0539) SpO2:  [99 %-100 %] 99 % (11/25 0539) Weight:  [135 lb (61.2 kg)] 135 lb (61.2 kg) (11/24 1311) Last BM Date: 03/09/16  Intake/Output from previous day: 11/24 0701 - 11/25 0700 In: 1200 [I.V.:1200] Out: 1603 [Urine:1600; Blood:3] Intake/Output this shift: No intake/output data recorded.  PE: Gen:  Alert, NAD, pleasant Pulm:  CTA, no W/R/R;  Abd: Soft, appropriately tender, ND, +BS,  incisions C/D/I Ext:  No erythema, edema, or tenderness   Lab Results:   Recent Labs  03/09/16 1609  WBC 16.0*  HGB 11.9*  HCT 35.7*  PLT 407*   BMET  Recent Labs  03/09/16 1609  NA 136  K 4.3  CL 105  CO2 23  GLUCOSE 94  BUN 5*  CREATININE 1.10*  CALCIUM 9.4   PT/INR No results for input(s): LABPROT, INR in the last 72 hours. CMP     Component Value Date/Time   NA 136 03/09/2016 1609   K 4.3 03/09/2016 1609   CL 105 03/09/2016 1609   CO2 23 03/09/2016 1609   GLUCOSE 94 03/09/2016 1609   BUN 5 (L) 03/09/2016 1609   CREATININE 1.10 (H) 03/09/2016 1609   CALCIUM 9.4 03/09/2016 1609   PROT 7.9 03/09/2016 1609   ALBUMIN 3.4 (L) 03/09/2016 1609   AST 24 03/09/2016 1609   ALT 12 (L) 03/09/2016 1609   ALKPHOS 55 03/09/2016 1609   BILITOT 0.3 03/09/2016 1609   GFRNONAA >60 03/09/2016 1609   GFRAA >60 03/09/2016 1609   Lipase     Component Value Date/Time   LIPASE 32 03/09/2016 1609       Studies/Results: Ct  Abdomen Pelvis W Contrast  Result Date: 03/09/2016 CLINICAL DATA:  Severe right lower quadrant pain EXAM: CT ABDOMEN AND PELVIS WITH CONTRAST TECHNIQUE: Multidetector CT imaging of the abdomen and pelvis was performed using the standard protocol following bolus administration of intravenous contrast. CONTRAST:  100 cc Isovue 300 intravenous COMPARISON:  None. FINDINGS: Lower chest: Visualized lung bases show no acute consolidation or pleural effusion. Hepatobiliary: No focal liver abnormality is seen. No gallstones, gallbladder wall thickening, or biliary dilatation. Pancreas: Unremarkable. No pancreatic ductal dilatation or surrounding inflammatory changes. Spleen: Normal in size without focal abnormality. Adrenals/Urinary Tract: Adrenal glands are unremarkable. Kidneys are normal, without renal calculi, focal lesion, or hydronephrosis. Bladder is unremarkable. Stomach/Bowel: Stomach is nonenlarged. No dilated small bowel to suggest bowel obstruction. Best seen on coronal views is a dilated tubular structure in the right lower quadrant measuring up to 1 cm in diameter, this is suspected to represent enlarged appendix. No perforation. No abscess. Small fluid in the right lower quadrant. Vascular/Lymphatic: No significant vascular findings are present. No enlarged abdominal or pelvic lymph nodes. Reproductive: Mildly prominent endometrial stripe. No adnexal masses. Other: Small free fluid in the pelvis. No free air. Edema within the pelvic fat. Musculoskeletal: No acute or significant osseous findings. IMPRESSION: 1. Findings are suspicious for acute appendicitis. There is  no evidence for perforation or abscess. 2. Small free fluid in the pelvis. Electronically Signed   By: Jasmine PangKim  Fujinaga M.D.   On: 03/09/2016 21:55    Anti-infectives: Anti-infectives    Start     Dose/Rate Route Frequency Ordered Stop   03/10/16 0200  piperacillin-tazobactam (ZOSYN) IVPB 3.375 g     3.375 g 12.5 mL/hr over 240 Minutes  Intravenous Every 8 hours 03/10/16 0038     03/09/16 2345  azithromycin (ZITHROMAX) tablet 1,000 mg     1,000 mg Oral  Once 03/09/16 2342 03/10/16 0014   03/09/16 2300  cefTRIAXone (ROCEPHIN) injection 250 mg     250 mg Intramuscular  Once 03/09/16 2252 03/10/16 0015       Assessment/Plan  acute appendicitis without perforation  POD#1 laparoscopic appendectomy Dr. Maisie Fushomas Cornett 03/10/16  Pain control - continue oxycodone, add 50 ULTRAM PRN, decrease Morphine dose   Start clears and advance as tolerated  Ambulate/IS  Dispo: Discharge this afternoon if tolerates diet and pain controlled w/ PO meds No abx at discharge    LOS: 1 day    Adam PhenixElizabeth S Jaidyn Usery , [facility] 03/11/2016, 9:10 AM Pager: [phone] Consults: [phone] Mon-Fri 7:00 am-4:30 pm Sat-Sun 7:00 am-11:30 am

## 2016-03-12 ENCOUNTER — Encounter (INDEPENDENT_AMBULATORY_CARE_PROVIDER_SITE_OTHER): Payer: Self-pay | Admitting: General Surgery

## 2016-03-12 NOTE — Progress Notes (Signed)
Pulled some oxycodone for patient before she left and upon asking her, she did not want it. I was unable to return it to the pyxis before she was taken out of the system. Witnessed waste with Elease HashimotoKristie Beckner, RN.

## 2016-03-12 NOTE — Progress Notes (Signed)
Jackelyn PolingAshli Coventry to be D/C'd  per MD order. Discussed with the patient and all questions fully answered.  VSS, Skin clean, dry and intact without evidence of skin break down, no evidence of skin tears noted.  IV catheter discontinued intact. Site without signs and symptoms of complications. Dressing and pressure applied.  An After Visit Summary was printed and given to the patient. Patient received prescription.  D/c education completed with patient/family including follow up instructions, medication list, d/c activities limitations if indicated, with other d/c instructions as indicated by MD - patient able to verbalize understanding, all questions fully answered.   Patient instructed to return to ED, call 911, or call MD for any changes in condition.   Patient to be escorted with family, wanted to walk out, and D/C home via private auto.

## 2016-03-12 NOTE — Progress Notes (Signed)
Witnessed IT sales professionalCarrie Aliea Bobe waste two 5 mg oxycodone

## 2016-03-12 NOTE — Discharge Summary (Signed)
[facility] Discharge Summary   Patient ID: Robin Floyd MRN: [id number] DOB/AGE: [birth year] 20 y.o.  Admit date: 03/09/2016 Discharge date: 03/12/2016  Admitting Diagnosis: Acute appendicitis  Discharge Diagnosis Patient Active Problem List   Diagnosis Date Noted  . Acute appendicitis 03/09/2016    Consultants None  Imaging: 03/09/16: CT ABD/PELVIS W/CONTRAST:  Best seen on coronal views is a dilated tubular structure in the right lower quadrant measuring up to 1 cm in diameter, this is suspected to represent enlarged appendix. No perforation. No abscess. Small fluid in the right lower quadrant.  Procedures Dr. Maisie Fushomas Cornett (03/10/16) - Laparoscopic Appendectomy  Hospital Course:  20 year old AA female who [facility] with two days of progressively worsening abdominal pain associated with one episode of vomiting.  Workup significant for Gonorrhea/Chlamydia and  leukocytosis of 16,000. EDP treated patient for G/C. CT as above. Patient was admitted and underwent procedure listed above. Tolerated procedure well and was transferred to the floor.  Diet was advanced as tolerated.  On POD#2, the patient was voiding well, tolerating diet, ambulating well, pain well controlled, vital signs stable, incisions c/d/i and felt stable for discharge home.  Patient will follow up in our office in 2 weeks and knows to call with questions or concerns.  She will call to confirm appointment date/time.    Physical Exam: General:  Alert, NAD, pleasant, comfortable Abd:  Soft, ND, appropriately tender, incisions C/D/I    Medication List    TAKE these medications   JOLESSA 0.15-0.03 MG tablet Generic drug:  levonorgestrel-ethinyl estradiol Take 1 tablet by mouth daily.   oxyCODONE-acetaminophen 5-325 MG tablet Commonly known as:  PERCOCET/ROXICET Take 1-2 tablets by mouth every 4 (four) hours as needed for moderate pain.       Follow-up Information    [facility] [place]. Schedule an appointment as soon as possible for a visit in 2 week(s).   Specialty:  General Surgery Why:  for post-operative follow-up Contact information: [address] [place] [id number] [phone]          Signed: Hosie SpangleElizabeth Destry Bezdek, [facility] 03/12/2016, 8:14 AM Pager: [phone] Consults: [phone] Mon-Fri 7:00 am-4:30 pm Sat-Sun 7:00 am-11:30 am

## 2016-03-15 NOTE — Addendum Note (Signed)
Addendum  created 03/15/16 [id number] by Cristela BlueKyle Taisley Mordan, MD   Anesthesia Staff edited

## 2016-06-26 DIAGNOSIS — Z113 Encounter for screening for infections with a predominantly sexual mode of transmission: Secondary | ICD-10-CM | POA: Diagnosis not present

## 2016-06-26 DIAGNOSIS — Z01419 Encounter for gynecological examination (general) (routine) without abnormal findings: Secondary | ICD-10-CM | POA: Diagnosis not present

## 2016-06-26 DIAGNOSIS — Z130 Encounter for screening for diseases of the blood and blood-forming organs and certain disorders involving the immune mechanism: Secondary | ICD-10-CM | POA: Diagnosis not present

## 2016-06-26 DIAGNOSIS — B373 Candidiasis of vulva and vagina: Secondary | ICD-10-CM | POA: Diagnosis not present

## 2016-06-26 DIAGNOSIS — N898 Other specified noninflammatory disorders of vagina: Secondary | ICD-10-CM | POA: Diagnosis not present

## 2016-06-26 DIAGNOSIS — Z6822 Body mass index (BMI) 22.0-22.9, adult: Secondary | ICD-10-CM | POA: Diagnosis not present

## 2016-06-26 DIAGNOSIS — N760 Acute vaginitis: Secondary | ICD-10-CM | POA: Diagnosis not present

## 2016-06-26 DIAGNOSIS — Z202 Contact with and (suspected) exposure to infections with a predominantly sexual mode of transmission: Secondary | ICD-10-CM | POA: Diagnosis not present

## 2016-06-26 DIAGNOSIS — Z1389 Encounter for screening for other disorder: Secondary | ICD-10-CM | POA: Diagnosis not present

## 2016-09-13 DIAGNOSIS — Z202 Contact with and (suspected) exposure to infections with a predominantly sexual mode of transmission: Secondary | ICD-10-CM | POA: Diagnosis not present

## 2016-11-21 DIAGNOSIS — L209 Atopic dermatitis, unspecified: Secondary | ICD-10-CM | POA: Diagnosis not present

## 2017-01-12 DIAGNOSIS — N760 Acute vaginitis: Secondary | ICD-10-CM | POA: Diagnosis not present

## 2017-01-12 DIAGNOSIS — N898 Other specified noninflammatory disorders of vagina: Secondary | ICD-10-CM | POA: Diagnosis not present

## 2017-01-12 DIAGNOSIS — Z202 Contact with and (suspected) exposure to infections with a predominantly sexual mode of transmission: Secondary | ICD-10-CM | POA: Diagnosis not present

## 2017-01-12 DIAGNOSIS — Z113 Encounter for screening for infections with a predominantly sexual mode of transmission: Secondary | ICD-10-CM | POA: Diagnosis not present

## 2017-05-10 ENCOUNTER — Other Ambulatory Visit: Payer: Self-pay

## 2017-05-10 ENCOUNTER — Ambulatory Visit: Payer: Federal, State, Local not specified - PPO | Admitting: Family Medicine

## 2017-05-10 ENCOUNTER — Encounter: Payer: Self-pay | Admitting: Family Medicine

## 2017-05-10 VITALS — BP 124/74 | HR 78 | Temp 98.8°F | Ht 66.93 in | Wt 134.0 lb

## 2017-05-10 DIAGNOSIS — J029 Acute pharyngitis, unspecified: Secondary | ICD-10-CM

## 2017-05-10 DIAGNOSIS — N760 Acute vaginitis: Secondary | ICD-10-CM | POA: Diagnosis not present

## 2017-05-10 DIAGNOSIS — Z711 Person with feared health complaint in whom no diagnosis is made: Secondary | ICD-10-CM

## 2017-05-10 DIAGNOSIS — B9689 Other specified bacterial agents as the cause of diseases classified elsewhere: Secondary | ICD-10-CM | POA: Diagnosis not present

## 2017-05-10 LAB — POCT WET + KOH PREP
Trich by wet prep: ABSENT
Yeast by KOH: ABSENT
Yeast by wet prep: ABSENT

## 2017-05-10 LAB — POCT RAPID STREP A (OFFICE): Rapid Strep A Screen: NEGATIVE

## 2017-05-10 MED ORDER — METRONIDAZOLE 500 MG PO TABS: 500.0000 mg | ORAL_TABLET | Freq: Two times a day (BID) | ORAL | 0 refills | Status: DC

## 2017-05-10 NOTE — Patient Instructions (Addendum)
     IF you received an x-ray today, you will receive an invoice from [facility]. Please contact [facility] at [phone] with questions or concerns regarding your invoice.   IF you received labwork today, you will receive an invoice from [facility]. Please contact [facility] at [phone] with questions or concerns regarding your invoice.   Our billing staff will not be able to assist you with questions regarding bills from these companies.  You will be contacted with the lab results as soon as they are available. The fastest way to get your results is to activate your My Chart account. Instructions are located on the last page of this paperwork. If you have not heard from us regarding the results in 2 weeks, please contact this office.     Pharyngitis Pharyngitis is a sore throat (pharynx). There is redness, pain, and swelling of your throat. Follow these instructions at home:  Drink enough fluids to keep your pee (urine) clear or pale yellow.  Only take medicine as told by your doctor.  You may get sick again if you do not take medicine as told. Finish your medicines, even if you start to feel better.  Do not take aspirin.  Rest.  Rinse your mouth (gargle) with salt water ( tsp of salt per 1 qt of water) every 1-2 hours. This will help the pain.  If you are not at risk for choking, you can suck on hard candy or sore throat lozenges. Contact a doctor if:  You have large, tender lumps on your neck.  You have a rash.  You cough up green, yellow-brown, or bloody spit. Get help right away if:  You have a stiff neck.  You drool or cannot swallow liquids.  You throw up (vomit) or are not able to keep medicine or liquids down.  You have very bad pain that does not go away with medicine.  You have problems breathing (not from a stuffy nose). This information is not intended to replace advice given to you by your health care provider. Make sure you  discuss any questions you have with your health care provider. Document Released: 09/20/2007 Document Revised: 09/09/2015 Document Reviewed: 12/09/2012 Elsevier Interactive Patient Education  2017 Elsevier Inc.  

## 2017-05-10 NOTE — Progress Notes (Signed)
1/24/201912:41 PM  [name] 1995-10-31, 22 y.o. female [id number]  Chief Complaint  Patient presents with  . Exposure to STD    WANTS HIV TESTING AS WELL  . Sore Throat    HPI:   Patient is a 23 y.o. female who presents today for 4 days of sore throat, chills, body aches, cough, congestion. Today feeling better. Has been drinking lots of hot tea.   Also requesting routine STD screening. Likes to be tested every 6 months. Reports inconsistent condom use but consistent OCP use. Denies any actual known exposures. Denies any symptoms.  Depression screen [facility] 2/9 05/10/2017  Decreased Interest 0  Down, Depressed, Hopeless 0  PHQ - 2 Score 0    Allergies  Allergen Reactions  . Sulfa Antibiotics Hives    Prior to Admission medications   Medication Sig Start Date End Date Taking? Authorizing Provider  levonorgestrel-ethinyl estradiol (JOLESSA) 0.15-0.03 MG tablet Take 1 tablet by mouth daily.   Yes [provider]    Past Medical History:  Diagnosis Date  . Asthma     Past Surgical History:  Procedure Laterality Date  . LAPAROSCOPIC APPENDECTOMY N/A 03/10/2016   Procedure: APPENDECTOMY LAPAROSCOPIC;  Surgeon: Harriette Bouillon, MD;  Location: [facility] OR;  Service: General;  Laterality: N/A;    Social History   Tobacco Use  . Smoking status: Never Smoker  . Smokeless tobacco: Never Used  Substance Use Topics  . Alcohol use: No    History reviewed. No pertinent family history.  ROS Per hpi  OBJECTIVE:  Blood pressure 124/74, pulse 78, temperature 98.8 F (37.1 C), temperature source Oral, height 5' 6.93" (1.7 m), weight 134 lb (60.8 kg), SpO2 100 %.  Physical Exam  Constitutional: She is oriented to person, place, and time and well-developed, well-nourished, and in no distress.  HENT:  Head: Normocephalic and atraumatic.  Right Ear: Hearing, tympanic membrane, external ear and ear canal normal.  Left Ear: Hearing, tympanic membrane, external ear and  ear canal normal.  Mouth/Throat: Mucous membranes are normal. Posterior oropharyngeal erythema present. No oropharyngeal exudate.  Eyes: EOM are normal. Pupils are equal, round, and reactive to light.  Neck: Neck supple.  Cardiovascular: Normal rate, regular rhythm and normal heart sounds. Exam reveals no gallop and no friction rub.  No murmur heard. Pulmonary/Chest: Effort normal and breath sounds normal. She has no wheezes. She has no rales.  Lymphadenopathy:    She has no cervical adenopathy.  Neurological: She is alert and oriented to person, place, and time. Gait normal.  Skin: Skin is warm and dry.    Results for orders placed or performed in visit on 05/10/17 (from the past 24 hour(s))  POCT rapid strep A     Status: Normal   Collection Time: 05/10/17 12:52 PM  Result Value Ref Range   Rapid Strep A Screen Negative Negative  POCT Wet + KOH Prep     Status: Abnormal   Collection Time: 05/10/17  1:17 PM  Result Value Ref Range   Yeast by KOH Absent Absent   Yeast by wet prep Absent Absent   WBC by wet prep Moderate (A) Few   Clue Cells Wet Prep HPF POC Many (A) None   Trich by wet prep Absent Absent   Bacteria Wet Prep HPF POC Moderate (A) Few   Epithelial Cells By [employer] ([facility]) None None, Few, Too numerous to count   RBC,UR,HPF,POC None None RBC/hpf     ASSESSMENT and PLAN  1. Sore  throat Discussed supportive measures for URI: increase hydration, rest, OTC medications, etc. RTC precautions discussed. - POCT rapid strep A  2. Concern about STD in female without diagnosis - POCT Wet + KOH Prep - GC/Chlamydia Probe Amp([facility]) - HIV antibody (with reflex) - RPR  3. Bacterial vaginosis Discussed diagnosis, new abx r/se/b.  - metroNIDAZOLE (FLAGYL) 500 MG tablet; Take 1 tablet (500 mg total) by mouth 2 (two) times daily.  Return if symptoms worsen or fail to improve.    Myles LippsIrma M Santiago, MD [facility] [address] [place], [place] [address] Ph.   [phone] Fax [phone]

## 2017-05-11 ENCOUNTER — Telehealth: Payer: Self-pay

## 2017-05-11 LAB — RPR: RPR Ser Ql: NONREACTIVE

## 2017-05-11 LAB — HIV ANTIBODY (ROUTINE TESTING W REFLEX): HIV Screen 4th Generation wRfx: NONREACTIVE

## 2017-05-11 NOTE — Telephone Encounter (Signed)
Pt called in to request her lab results    CB: [phone]

## 2017-05-12 LAB — GC/CHLAMYDIA PROBE AMP
Chlamydia trachomatis, NAA: NEGATIVE
Neisseria gonorrhoeae by PCR: NEGATIVE

## 2017-05-13 NOTE — Telephone Encounter (Signed)
done

## 2017-08-09 ENCOUNTER — Emergency Department (HOSPITAL_COMMUNITY)
Admission: EM | Admit: 2017-08-09 | Discharge: 2017-08-09 | Disposition: A | Discharge status: Home / Self Care | Payer: Federal, State, Local not specified - PPO | Attending: Emergency Medicine | Admitting: Emergency Medicine

## 2017-08-09 ENCOUNTER — Encounter (HOSPITAL_COMMUNITY): Payer: Self-pay

## 2017-08-09 DIAGNOSIS — J45909 Unspecified asthma, uncomplicated: Secondary | ICD-10-CM | POA: Insufficient documentation

## 2017-08-09 DIAGNOSIS — Z79899 Other long term (current) drug therapy: Secondary | ICD-10-CM | POA: Diagnosis not present

## 2017-08-09 DIAGNOSIS — R509 Fever, unspecified: Secondary | ICD-10-CM | POA: Diagnosis not present

## 2017-08-09 DIAGNOSIS — J0100 Acute maxillary sinusitis, unspecified: Secondary | ICD-10-CM | POA: Insufficient documentation

## 2017-08-09 DIAGNOSIS — M7918 Myalgia, other site: Secondary | ICD-10-CM | POA: Diagnosis not present

## 2017-08-09 MED ORDER — ACETAMINOPHEN 325 MG PO TABS
650.0000 mg | ORAL_TABLET | Freq: Once | ORAL | Status: AC
  Administered 2017-08-09: 650 mg via ORAL
  Filled 2017-08-09: qty 2

## 2017-08-09 MED ORDER — AMOXICILLIN-POT CLAVULANATE 875-125 MG PO TABS: 1.0000 | ORAL_TABLET | Freq: Two times a day (BID) | ORAL | 0 refills | Status: DC

## 2017-08-09 MED ORDER — CETIRIZINE-PSEUDOEPHEDRINE ER 5-120 MG PO TB12: 1.0000 | ORAL_TABLET | Freq: Every day | ORAL | 0 refills | Status: DC

## 2017-08-09 MED ORDER — FLUTICASONE PROPIONATE 50 MCG/ACT NA SUSP: 2.0000 | Freq: Every day | NASAL | 0 refills | Status: DC

## 2017-08-09 NOTE — ED Notes (Signed)
Bed: WTR5 Expected date:  Expected time:  Means of arrival:  Comments: 

## 2017-08-09 NOTE — ED Provider Notes (Signed)
[facility]-EMERGENCY DEPT Provider Note   CSN: [id number] Arrival date & time: 08/09/17  1932     History   Chief Complaint Chief Complaint  Patient presents with  . Generalized Body Aches    HPI Robin Floyd is a 22 y.o. female with history of asthma who presents with a 2-day history of nasal congestion, generalized body aches, and chills.  She has not had documented fever at home.  She has had associated facial pain and headache.  She denies any sore throat, ear pain, cough, chest pain, shortness of breath, abdominal pain, nausea, vomiting.  Patient took Zyrtec at home which did not seem to help.  She thought it may have just been her allergies.  She denies any other symptoms.  HPI  Past Medical History:  Diagnosis Date  . Asthma     Patient Active Problem List   Diagnosis Date Noted  . Acute appendicitis 03/09/2016    Past Surgical History:  Procedure Laterality Date  . LAPAROSCOPIC APPENDECTOMY N/A 03/10/2016   Procedure: APPENDECTOMY LAPAROSCOPIC;  Surgeon: Robin Bouillonhomas Cornett, Floyd;  Location: [facility] OR;  Service: General;  Laterality: N/A;     OB History   None      Home Medications    Prior to Admission medications   Medication Sig Start Date End Date Taking? Authorizing Provider  amoxicillin-clavulanate (AUGMENTIN) 875-125 MG tablet Take 1 tablet by mouth every 12 (twelve) hours. 08/09/17   Robin Floyd  cetirizine-pseudoephedrine (ZYRTEC-D) 5-120 MG tablet Take 1 tablet by mouth daily. 08/09/17   Haili Donofrio, Robin BogaAlexandra Floyd, Floyd  fluticasone (FLONASE) 50 MCG/ACT nasal spray Place 2 sprays into both nostrils daily. 08/09/17   Robin Floyd  levonorgestrel-ethinyl estradiol (JOLESSA) 0.15-0.03 MG tablet Take 1 tablet by mouth daily.    Provider, Historical, Floyd  metroNIDAZOLE (FLAGYL) 500 MG tablet Take 1 tablet (500 mg total) by mouth 2 (two) times daily. 05/10/17   Robin Floyd    Family History Family History  Problem Relation  Age of Onset  . Healthy Mother   . Cancer Father   . Healthy Sister   . Healthy Brother   . Hypertension Maternal Grandmother     Social History Social History   Tobacco Use  . Smoking status: Never Smoker  . Smokeless tobacco: Never Used  Substance Use Topics  . Alcohol use: No  . Drug use: No     Allergies   Sulfa antibiotics   Review of Systems Review of Systems  Constitutional: Positive for chills and fever.  HENT: Positive for congestion, sinus pressure and sinus pain. Negative for ear pain, facial swelling and sore throat.   Respiratory: Negative for cough and shortness of breath.   Cardiovascular: Negative for chest pain.  Gastrointestinal: Negative for abdominal pain, nausea and vomiting.  Genitourinary: Negative for dysuria.  Musculoskeletal: Positive for myalgias. Negative for back pain.  Skin: Negative for rash and wound.  Neurological: Negative for headaches.  Psychiatric/Behavioral: The patient is not nervous/anxious.      Physical Exam Updated Vital Signs BP 123/76 (BP Location: Left Arm)   Pulse (!) 119   Temp 99.2 F (37.3 C) (Oral)   Resp 18   Ht 5\' 6"  (1.676 Floyd)   Wt 59.9 kg (132 lb)   LMP 06/11/2017   SpO2 100%   BMI 21.31 kg/Floyd   Physical Exam  Constitutional: She appears well-developed and well-nourished. No distress.  Temp 101.0 on my exam  HENT:  Head: Normocephalic  and atraumatic.  Nose: Right sinus exhibits maxillary sinus tenderness. Left sinus exhibits maxillary sinus tenderness.  Mouth/Throat: Oropharynx is clear and moist. No oropharyngeal exudate.  Eyes: Pupils are equal, round, and reactive to light. Conjunctivae are normal. Right eye exhibits no discharge. Left eye exhibits no discharge. No scleral icterus.  Neck: Normal range of motion. Neck supple. No thyromegaly present.  Cardiovascular: Regular rhythm, normal heart sounds and intact distal pulses. Exam reveals no gallop and no friction rub.  No murmur  heard. Tachycardia, suspect due to fever  Pulmonary/Chest: Effort normal and breath sounds normal. No stridor. No respiratory distress. She has no wheezes. She has no rales.  Abdominal: Soft. Bowel sounds are normal. She exhibits no distension. There is no tenderness. There is no rebound and no guarding.  Musculoskeletal: She exhibits no edema.  Lymphadenopathy:    She has no cervical adenopathy.  Neurological: She is alert. Coordination normal.  Skin: Skin is warm and dry. No rash noted. She is not diaphoretic. No pallor.  Psychiatric: She has a normal mood and affect.  Nursing note and vitals reviewed.    ED Treatments / Results  Labs (all labs ordered are listed, but only abnormal results are displayed) Labs Reviewed - No data to display  EKG None  Radiology No results found.  Procedures Procedures (including critical care time)  Medications Ordered in ED Medications  acetaminophen (TYLENOL) tablet 650 mg (has no administration in time range)     Initial Impression / Assessment and Plan / ED Course  I have reviewed the triage vital signs and the nursing notes.  Pertinent labs & imaging results that were available during my care of the patient were reviewed by me and considered in my medical decision making (see chart for details).     Patient with acute maxillary sinusitis.  Patient with fever up to 101 in the ED.  She has no other symptoms other than sinus pressure and nasal congestion.  Will treat with Augmentin considering fever and considerable facial tenderness and pain.  We will also treat supportively with Flonase, Zyrtec-D.  Low suspicion for flu, as patient has no other symptoms besides fever and sinus congestion/pressure.  Patient reports she gets menstrual cycles every 3 months because of her birth control.  No concern for pregnancy.  Return precautions discussed.  Patient understands and agrees with plan.  Patient vitals stable discharge in satisfactory  condition.  Final Clinical Impressions(s) / ED Diagnoses   Final diagnoses:  Acute non-recurrent maxillary sinusitis    ED Discharge Orders        Ordered    amoxicillin-clavulanate (AUGMENTIN) 875-125 MG tablet  Every 12 hours     08/09/17 2026    fluticasone (FLONASE) 50 MCG/ACT nasal spray  Daily     08/09/17 2026    cetirizine-pseudoephedrine (ZYRTEC-D) 5-120 MG tablet  Daily     08/09/17 2026       Robin Holes, Floyd 08/09/17 2031    Phillis Haggis, Floyd 08/09/17 2050

## 2017-08-09 NOTE — Discharge Instructions (Signed)
Take Augmentin until completed.  Take Zyrtec-D 1-2 times daily as needed for nasal congestion.  Use Flonase once daily for nasal congestion.  Alternate ibuprofen and Tylenol as prescribed over-the-counter, as needed for fever and body aches.  Please return to the emergency department if you develop any new or worsening symptoms.

## 2017-08-09 NOTE — ED Triage Notes (Signed)
Pt reports that she has had body aches x 2 days and feels like she cant breat out of her nose. Pt denies coughing, N/V/D. Pt reports that she took a Zyrtec and her symptoms worsened.

## 2017-08-27 IMAGING — CT CT ABD-PELV W/ CM
2 of 4 series · 15 of 46 positions shown, 17 images · IV contrast (isovue)
Comparison: None.

CLINICAL DATA: Severe right lower quadrant pain

EXAM:
CT ABDOMEN AND PELVIS WITH CONTRAST
TECHNIQUE: Multidetector CT imaging of the abdomen and pelvis was performed
using the standard protocol following bolus administration of
intravenous contrast.
CONTRAST:  100 cc Isovue 300 intravenous

[Series 2: a/p w/ 5mm · axial · 0.54mm/px · z∈[+392,+792]mm · 12 of 88 slices shown, 14 images]
[im 4/88  soft-tissue]
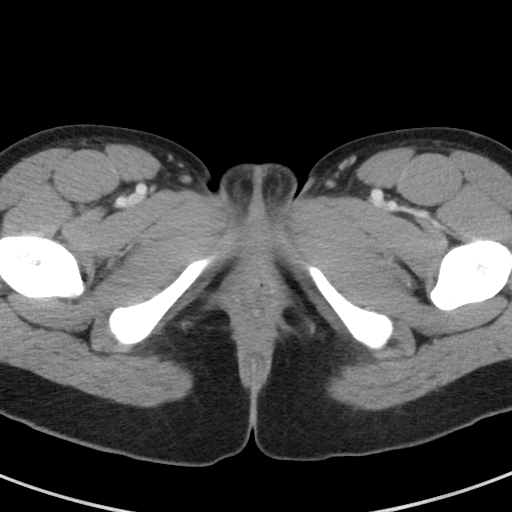
[im 4/88  bone]
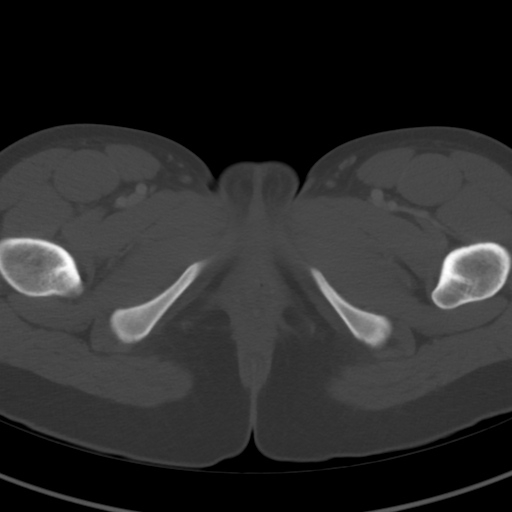
[im 11/88  soft-tissue]
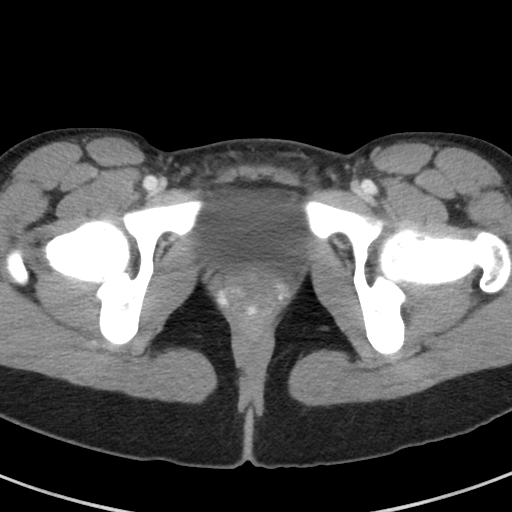
[im 19/88  soft-tissue]
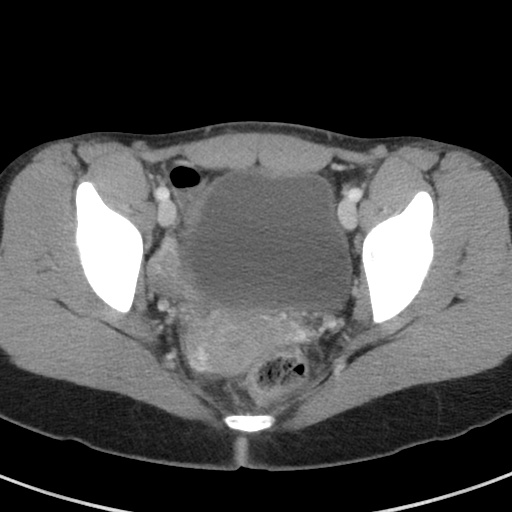
[im 26/88  soft-tissue]
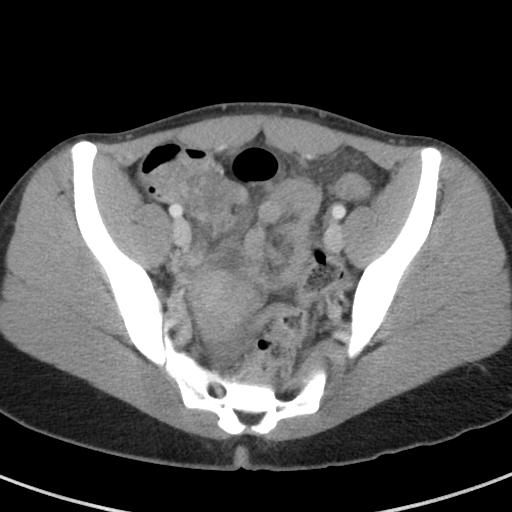
[im 33/88  soft-tissue]
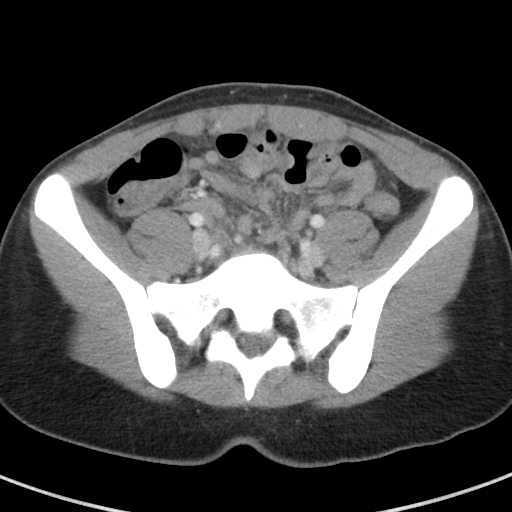
[im 40/88  soft-tissue]
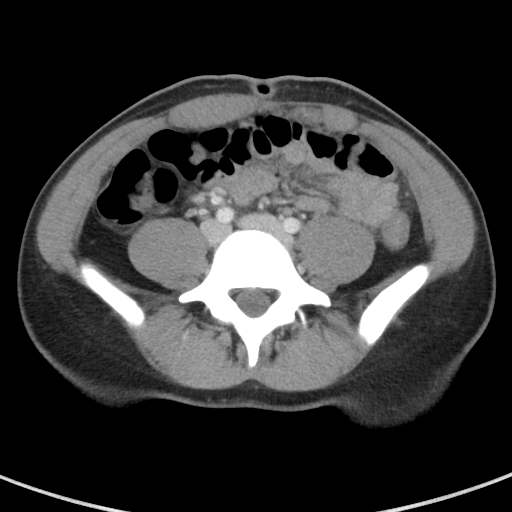
[im 48/88  soft-tissue]
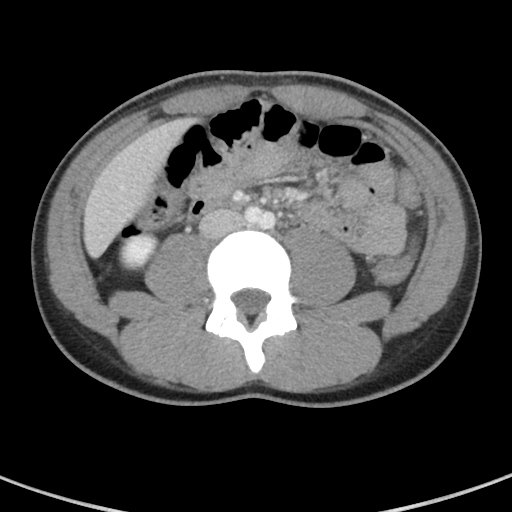
[im 55/88  soft-tissue]
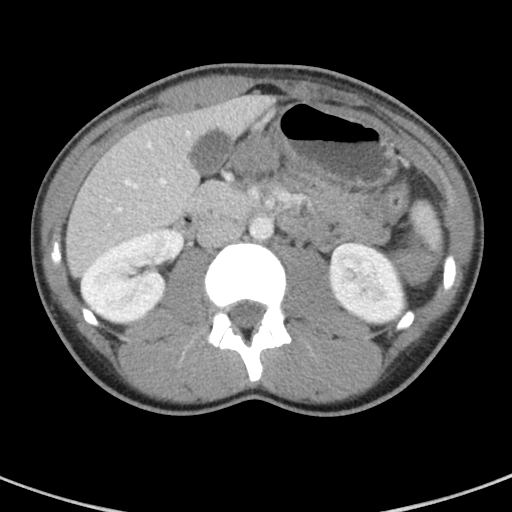
[im 62/88  soft-tissue]
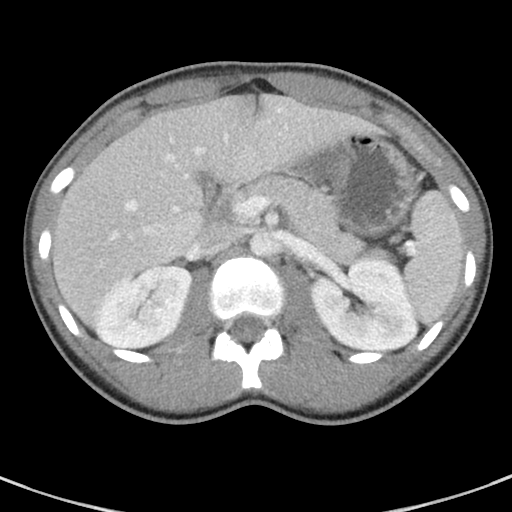
[im 62/88  bone]
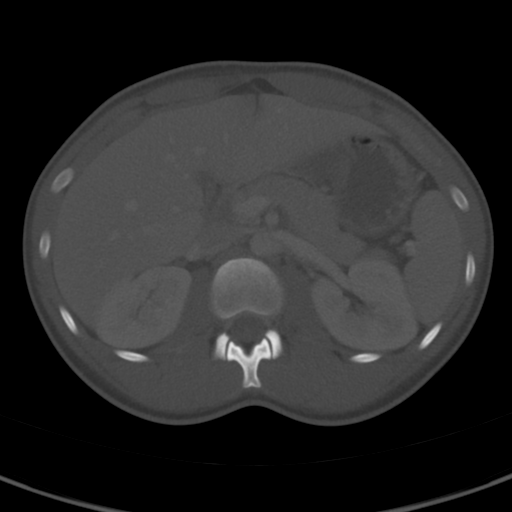
[im 69/88  soft-tissue]
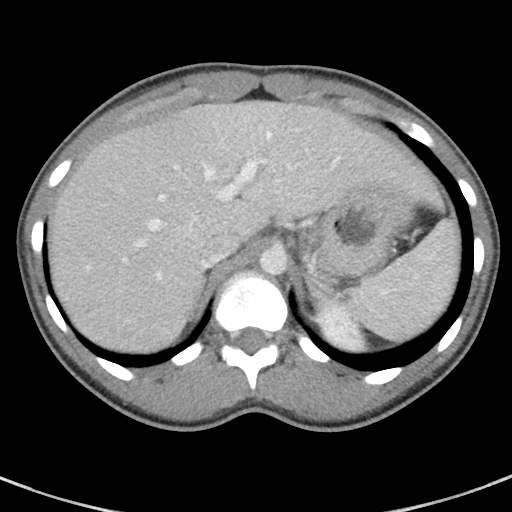
[im 77/88  soft-tissue]
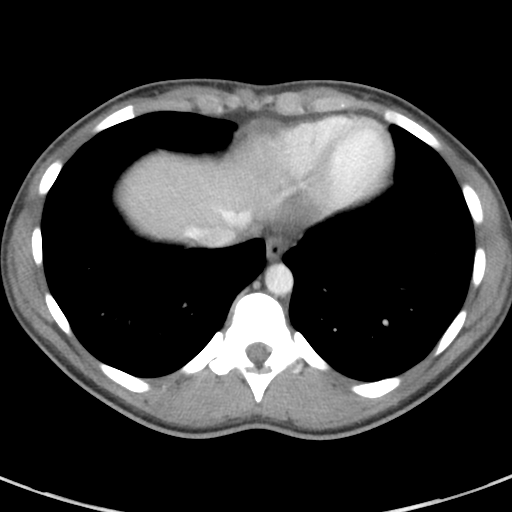
[im 84/88  soft-tissue]
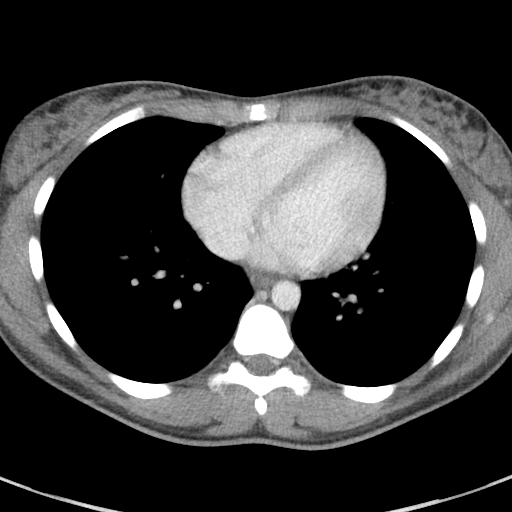

[Series 5: a/p w/ cor · coronal · 0.57mm/px · 3 of 94 slices shown]
[im 32/94  soft-tissue]
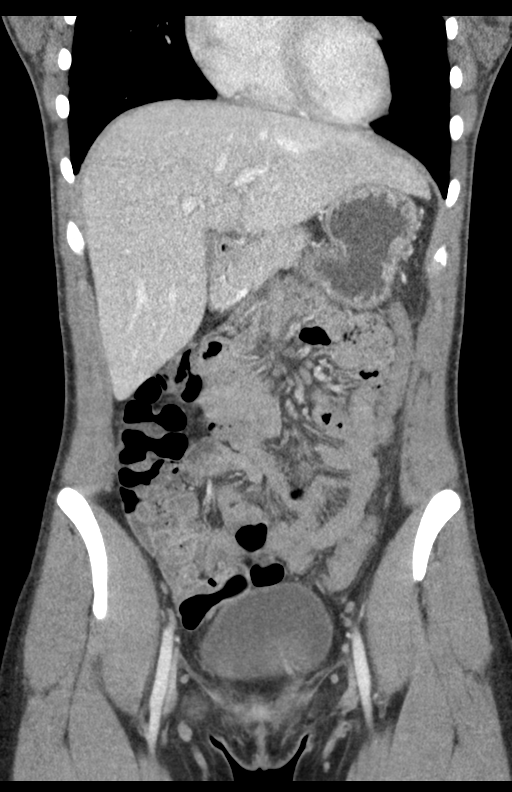
[im 42/94  soft-tissue]
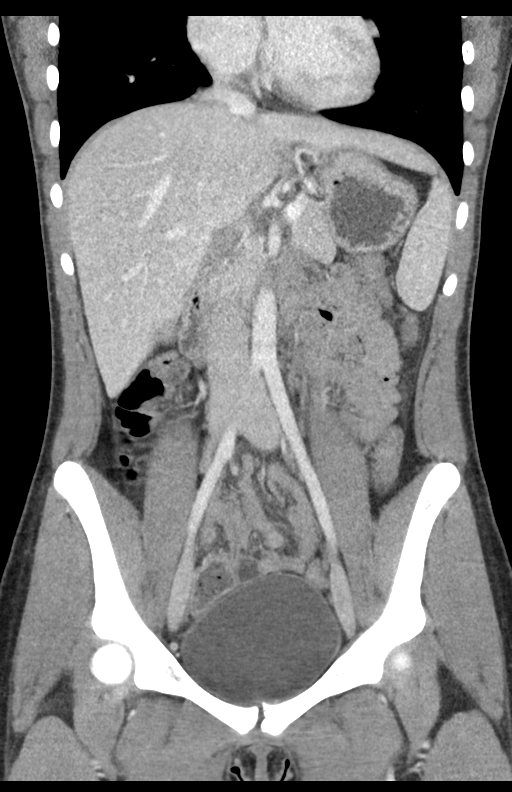
[im 52/94  soft-tissue]
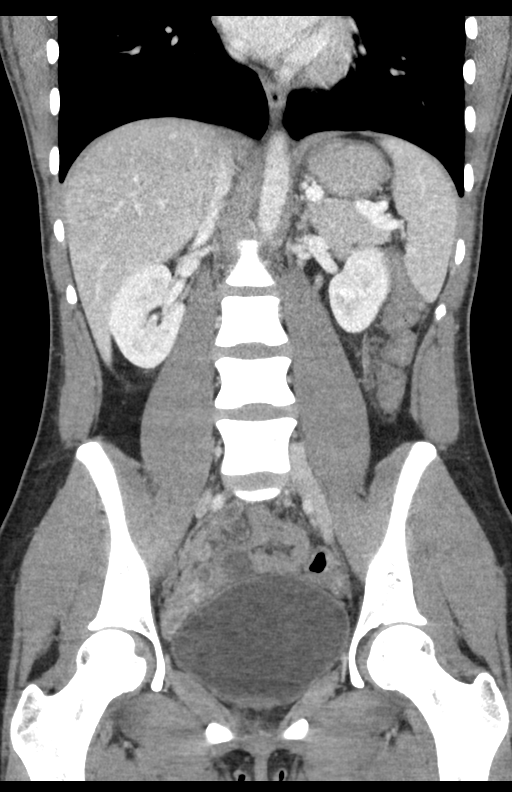

[15 of 46 positions shown; findings below may reference images not displayed]

FINDINGS: Lower chest: Visualized lung bases show no acute consolidation or
pleural effusion.

Hepatobiliary: No focal liver abnormality is seen. No gallstones,
gallbladder wall thickening, or biliary dilatation.

Pancreas: Unremarkable. No pancreatic ductal dilatation or
surrounding inflammatory changes.

Spleen: Normal in size without focal abnormality.

Adrenals/Urinary Tract: Adrenal glands are unremarkable. Kidneys are
normal, without renal calculi, focal lesion, or hydronephrosis.
Bladder is unremarkable.

Stomach/Bowel: Stomach is nonenlarged. No dilated small bowel to
suggest bowel obstruction. Best seen on coronal views is a dilated
tubular structure in the right lower quadrant measuring up to 1 cm
in diameter, this is suspected to represent enlarged appendix. No
perforation. No abscess. Small fluid in the right lower quadrant.

Vascular/Lymphatic: No significant vascular findings are present. No
enlarged abdominal or pelvic lymph nodes.

Reproductive: Mildly prominent endometrial stripe. No adnexal
masses.

Other: Small free fluid in the pelvis. No free air. Edema within the
pelvic fat.

Musculoskeletal: No acute or significant osseous findings.
IMPRESSION: 1. Findings are suspicious for acute appendicitis. There is no
evidence for perforation or abscess.
2. Small free fluid in the pelvis.

## 2017-10-15 DIAGNOSIS — Z1389 Encounter for screening for other disorder: Secondary | ICD-10-CM | POA: Diagnosis not present

## 2017-10-15 DIAGNOSIS — Z01419 Encounter for gynecological examination (general) (routine) without abnormal findings: Secondary | ICD-10-CM | POA: Diagnosis not present

## 2017-10-15 DIAGNOSIS — N898 Other specified noninflammatory disorders of vagina: Secondary | ICD-10-CM | POA: Diagnosis not present

## 2017-10-15 DIAGNOSIS — R109 Unspecified abdominal pain: Secondary | ICD-10-CM | POA: Diagnosis not present

## 2017-10-15 DIAGNOSIS — Z793 Long term (current) use of hormonal contraceptives: Secondary | ICD-10-CM | POA: Diagnosis not present

## 2017-10-15 DIAGNOSIS — A749 Chlamydial infection, unspecified: Secondary | ICD-10-CM | POA: Diagnosis not present

## 2017-10-15 DIAGNOSIS — Z113 Encounter for screening for infections with a predominantly sexual mode of transmission: Secondary | ICD-10-CM | POA: Diagnosis not present

## 2017-10-15 DIAGNOSIS — R8290 Unspecified abnormal findings in urine: Secondary | ICD-10-CM | POA: Diagnosis not present

## 2017-12-12 ENCOUNTER — Ambulatory Visit: Payer: Federal, State, Local not specified - PPO | Admitting: Family Medicine

## 2017-12-12 ENCOUNTER — Encounter: Payer: Self-pay | Admitting: Family Medicine

## 2017-12-12 ENCOUNTER — Other Ambulatory Visit: Payer: Self-pay

## 2017-12-12 VITALS — BP 106/60 | HR 114 | Temp 99.1°F | Ht 66.0 in | Wt 129.0 lb

## 2017-12-12 DIAGNOSIS — J020 Streptococcal pharyngitis: Secondary | ICD-10-CM

## 2017-12-12 DIAGNOSIS — R202 Paresthesia of skin: Secondary | ICD-10-CM | POA: Diagnosis not present

## 2017-12-12 DIAGNOSIS — J029 Acute pharyngitis, unspecified: Secondary | ICD-10-CM

## 2017-12-12 DIAGNOSIS — R200 Anesthesia of skin: Secondary | ICD-10-CM

## 2017-12-12 LAB — POCT RAPID STREP A (OFFICE): Rapid Strep A Screen: POSITIVE — AB

## 2017-12-12 MED ORDER — AMOXICILLIN-POT CLAVULANATE 875-125 MG PO TABS: 1.0000 | ORAL_TABLET | Freq: Two times a day (BID) | ORAL | 0 refills | Status: AC

## 2017-12-12 MED ORDER — FLUCONAZOLE 150 MG PO TABS: 150.0000 mg | ORAL_TABLET | Freq: Once | ORAL | 0 refills | Status: AC

## 2017-12-12 NOTE — Progress Notes (Signed)
Chief Complaint  Patient presents with  . Sore Throat    started last week   . Nasal Congestion  . feeling of loos of blood flow    in right arm and in both legs. going on a year but happens more frequently this past week     HPI  Sore throat Reports fevers and chills  Sore throat and pain with swallowing  Started a week ago  She also has some congestion  Numbness  She states that in the past few weeks she has been getting some numbness in her legs that lasts for a few minutes No pain No tremors No weakness She can wiggle her toes Her hands are affected sometimes No dizziness  Past Medical History:  Diagnosis Date  . Asthma     Current Outpatient Medications  Medication Sig Dispense Refill  . cetirizine-pseudoephedrine (ZYRTEC-D) 5-120 MG tablet Take 1 tablet by mouth daily. 30 tablet 0  . fluticasone (FLONASE) 50 MCG/ACT nasal spray Place 2 sprays into both nostrils daily. 5 g 0  . levonorgestrel-ethinyl estradiol (JOLESSA) 0.15-0.03 MG tablet Take 1 tablet by mouth daily.    Marland Kitchen amoxicillin-clavulanate (AUGMENTIN) 875-125 MG tablet Take 1 tablet by mouth every 12 (twelve) hours for 10 days. 20 tablet 0  . fluconazole (DIFLUCAN) 150 MG tablet Take 1 tablet (150 mg total) by mouth once for 1 dose. Repeat 2nd dose in 3 days. For yeast infection 2 tablet 0   No current facility-administered medications for this visit.     Allergies:  Allergies  Allergen Reactions  . Sulfa Antibiotics Hives    Past Surgical History:  Procedure Laterality Date  . LAPAROSCOPIC APPENDECTOMY N/A 03/10/2016   Procedure: APPENDECTOMY LAPAROSCOPIC;  Surgeon: Harriette Bouillon, MD;  Location: [facility] OR;  Service: General;  Laterality: N/A;    Social History   Socioeconomic History  . Marital status: Unknown    Spouse name: Not on file  . Number of children: Not on file  . Years of education: Not on file  . Highest education level: Not on file  Occupational History  . Not on file  Social  Needs  . Financial resource strain: Not on file  . Food insecurity:    Worry: Not on file    Inability: Not on file  . Transportation needs:    Medical: Not on file    Non-medical: Not on file  Tobacco Use  . Smoking status: Never Smoker  . Smokeless tobacco: Never Used  Substance and Sexual Activity  . Alcohol use: No  . Drug use: No  . Sexual activity: Yes    Birth control/protection: Pill  Lifestyle  . Physical activity:    Days per week: Not on file    Minutes per session: Not on file  . Stress: Not on file  Relationships  . Social connections:    Talks on phone: Not on file    Gets together: Not on file    Attends religious service: Not on file    Active member of club or organization: Not on file    Attends meetings of clubs or organizations: Not on file    Relationship status: Not on file  Other Topics Concern  . Not on file  Social History Narrative  . Not on file    Family History  Problem Relation Age of Onset  . Healthy Mother   . Cancer Father   . Healthy Sister   . Healthy Brother   . Hypertension Maternal Grandmother  ROS Review of Systems See HPI Constitution: No fevers or chills No malaise No diaphoresis Skin: No rash or itching Eyes: no blurry vision, no double vision GU: no dysuria or hematuria Neuro: no dizziness or headaches all others reviewed and negative   Objective: Vitals:   12/12/17 1426  BP: 106/60  Pulse: (!) 114  Temp: 99.1 F (37.3 C)  TempSrc: Oral  SpO2: 100%  Weight: 129 lb (58.5 kg)  Height: 5\' 6"  (1.676 m)    Physical Exam General: alert, oriented, in NAD Head: normocephalic, atraumatic, no sinus tenderness Eyes: EOM intact, no scleral icterus or conjunctival injection Ears: TM clear bilaterally Nose: mucosa nonerythematous, nonedematous Throat: no pharyngeal exudate or erythema Lymph: no posterior auricular, submental or cervical lymph adenopathy Heart: normal rate, normal sinus rhythm, no  murmurs Lungs: clear to auscultation bilaterally, no wheezing Neuro: motor 5/5 in all extremities, patellar tendon reflex 2+,  Rapid strep positive  Assessment and Plan Robin Floyd was seen today for sore throat, nasal congestion and feeling of loos of blood flow.  Diagnoses and all orders for this visit:  Strep pharyngitis- discussed results  Rapid strep positive Reviewed allergies  -     amoxicillin-clavulanate (AUGMENTIN) 875-125 MG tablet; Take 1 tablet by mouth every 12 (twelve) hours for 10 days. -     fluconazole (DIFLUCAN) 150 MG tablet; Take 1 tablet (150 mg total) by mouth once for 1 dose. Repeat 2nd dose in 3 days. For yeast infection  Sore throat -     POCT rapid strep A -     Culture, Group A Strep  Numbness and tingling of both legs- will screen for underlying factors -     Vitamin B12 -     TSH -     CBC  Other orders     Robin Floyd A Robin Floyd

## 2017-12-12 NOTE — Patient Instructions (Addendum)
If you have lab work done today you will be contacted with your lab results within the next 2 weeks.  If you have not heard from us then please contact us. The fastest way to get your results is to register for My Chart.   IF you received an x-ray today, you will receive an invoice from [facility]. Please contact [facility] at [phone] with questions or concerns regarding your invoice.   IF you received labwork today, you will receive an invoice from [facility]. Please contact [facility] at [phone] with questions or concerns regarding your invoice.   Our billing staff will not be able to assist you with questions regarding bills from these companies.  You will be contacted with the lab results as soon as they are available. The fastest way to get your results is to activate your My Chart account. Instructions are located on the last page of this paperwork. If you have not heard from us regarding the results in 2 weeks, please contact this office.      Strep Throat Strep throat is a bacterial infection of the throat. Your health care provider may call the infection tonsillitis or pharyngitis, depending on whether there is swelling in the tonsils or at the back of the throat. Strep throat is most common during the cold months of the year in children who are 495-21 years of age, but it can happen during any season in people of any age. This infection is spread from person to person (contagious) through coughing, sneezing, or close contact. What are the causes? Strep throat is caused by the bacteria called Streptococcus pyogenes. What increases the risk? This condition is more likely to develop in:  People who spend time in crowded places where the infection can spread easily.  People who have close contact with someone who has strep throat.  What are the signs or symptoms? Symptoms of this condition include:  Fever or chills.  Redness, swelling, or pain in  the tonsils or throat.  Pain or difficulty when swallowing.  White or yellow spots on the tonsils or throat.  Swollen, tender glands in the neck or under the jaw.  Red rash all over the body (rare).  How is this diagnosed? This condition is diagnosed by performing a rapid strep test or by taking a swab of your throat (throat culture test). Results from a rapid strep test are usually ready in a few minutes, but throat culture test results are available after one or two days. How is this treated? This condition is treated with antibiotic medicine. Follow these instructions at home: Medicines  Take over-the-counter and prescription medicines only as told by your health care provider.  Take your antibiotic as told by your health care provider. Do not stop taking the antibiotic even if you start to feel better.  Have family members who also have a sore throat or fever tested for strep throat. They may need antibiotics if they have the strep infection. Eating and drinking  Do not share food, drinking cups, or personal items that could cause the infection to spread to other people.  If swallowing is difficult, try eating soft foods until your sore throat feels better.  Drink enough fluid to keep your urine clear or pale yellow. General instructions  Gargle with a salt-water mixture 3-4 times per day or as needed. To make a salt-water mixture, completely dissolve -1 tsp of salt in 1 cup of warm water.  Make sure that  all household members wash their hands well.  Get plenty of rest.  Stay home from school or work until you have been taking antibiotics for 24 hours.  Keep all follow-up visits as told by your health care provider. This is important. Contact a health care provider if:  The glands in your neck continue to get bigger.  You develop a rash, cough, or earache.  You cough up a thick liquid that is green, yellow-brown, or bloody.  You have pain or discomfort that does  not get better with medicine.  Your problems seem to be getting worse rather than better.  You have a fever. Get help right away if:  You have new symptoms, such as vomiting, severe headache, stiff or painful neck, chest pain, or shortness of breath.  You have severe throat pain, drooling, or changes in your voice.  You have swelling of the neck, or the skin on the neck becomes red and tender.  You have signs of dehydration, such as fatigue, dry mouth, and decreased urination.  You become increasingly sleepy, or you cannot wake up completely.  Your joints become red or painful. This information is not intended to replace advice given to you by your health care provider. Make sure you discuss any questions you have with your health care provider. Document Released: 03/31/2000 Document Revised: 12/01/2015 Document Reviewed: 07/27/2014 Elsevier Interactive Patient Education  [employer].

## 2017-12-13 LAB — VITAMIN B12: VITAMIN B 12: 445 pg/mL (ref 232–1245)

## 2017-12-13 LAB — CBC
HEMATOCRIT: 36 % (ref 34.0–46.6)
Hemoglobin: 11.8 g/dL (ref 11.1–15.9)
MCH: 27.3 pg (ref 26.6–33.0)
MCHC: 32.8 g/dL (ref 31.5–35.7)
MCV: 83 fL (ref 79–97)
Platelets: 394 10*3/uL (ref 150–450)
RBC: 4.32 x10E6/uL (ref 3.77–5.28)
RDW: 14.4 % (ref 12.3–15.4)
WBC: 16.6 10*3/uL — AB (ref 3.4–10.8)

## 2017-12-13 LAB — TSH: TSH: 1.41 u[IU]/mL (ref 0.450–4.500)

## 2018-01-14 DIAGNOSIS — Z113 Encounter for screening for infections with a predominantly sexual mode of transmission: Secondary | ICD-10-CM | POA: Diagnosis not present

## 2018-01-16 DIAGNOSIS — A5409 Other gonococcal infection of lower genitourinary tract: Secondary | ICD-10-CM | POA: Diagnosis not present

## 2018-02-19 DIAGNOSIS — Z23 Encounter for immunization: Secondary | ICD-10-CM | POA: Diagnosis not present

## 2018-02-21 ENCOUNTER — Ambulatory Visit: Payer: Federal, State, Local not specified - PPO | Admitting: Emergency Medicine

## 2018-02-21 ENCOUNTER — Encounter: Payer: Self-pay | Admitting: Emergency Medicine

## 2018-02-21 ENCOUNTER — Other Ambulatory Visit: Payer: Self-pay

## 2018-02-21 VITALS — BP 103/67 | HR 97 | Temp 98.8°F | Resp 16 | Ht 65.75 in | Wt 125.2 lb

## 2018-02-21 DIAGNOSIS — R590 Localized enlarged lymph nodes: Secondary | ICD-10-CM | POA: Diagnosis not present

## 2018-02-21 DIAGNOSIS — R112 Nausea with vomiting, unspecified: Secondary | ICD-10-CM | POA: Diagnosis not present

## 2018-02-21 LAB — POCT URINALYSIS DIP (MANUAL ENTRY)
Bilirubin, UA: NEGATIVE
Blood, UA: NEGATIVE
Glucose, UA: NEGATIVE mg/dL
Ketones, POC UA: NEGATIVE mg/dL
NITRITE UA: NEGATIVE
PH UA: 8.5 — AB (ref 5.0–8.0)
Spec Grav, UA: 1.02 (ref 1.010–1.025)
UROBILINOGEN UA: 0.2 U/dL

## 2018-02-21 LAB — POCT URINE PREGNANCY: Preg Test, Ur: NEGATIVE

## 2018-02-21 MED ORDER — AZITHROMYCIN 250 MG PO TABS: ORAL_TABLET | ORAL | 0 refills | Status: DC

## 2018-02-21 NOTE — Progress Notes (Signed)
Robin Floyd 22 y.o.   Chief Complaint  Patient presents with  . thyroid glands    per patient swollen x 4 days  . Nausea    with vomiting started today    HISTORY OF PRESENT ILLNESS: [name] complaining of swelling and tender cervical lymph nodes followed by no symptoms today.  Mild sore throat but no difficulty swallowing.  Denies cough or difficulty breathing.  Denies chest pain.  Denies abdominal pain.  Sexually active but no possibility of pregnancy.  Denies urinary symptoms or abnormal vaginal discharge.  Denies any other significant symptoms.  HPI   Prior to Admission medications   Medication Sig Start Date End Date Taking? Authorizing Provider  cetirizine-pseudoephedrine (ZYRTEC-D) 5-120 MG tablet Take 1 tablet by mouth daily. 08/09/17  Yes Floyd, Robin M, Floyd  fluticasone (FLONASE) 50 MCG/ACT nasal spray Place 2 sprays into both nostrils daily. 08/09/17  Yes Floyd, Robin Boga, Floyd  levonorgestrel-ethinyl estradiol (JOLESSA) 0.15-0.03 MG tablet Take 1 tablet by mouth daily.   Yes [provider]    Allergies  Allergen Reactions  . Sulfa Antibiotics Hives    Patient Active Problem List   Diagnosis Date Noted  . Acute appendicitis 03/09/2016    Past Medical History:  Diagnosis Date  . Asthma     Past Surgical History:  Procedure Laterality Date  . LAPAROSCOPIC APPENDECTOMY N/A 03/10/2016   Procedure: APPENDECTOMY LAPAROSCOPIC;  Surgeon: Harriette Bouillon, MD;  Location: [facility] OR;  Service: General;  Laterality: N/A;    Social History   Socioeconomic History  . Marital status: Unknown    Spouse name: Not on file  . Number of children: Not on file  . Years of education: Not on file  . Highest education level: Not on file  Occupational History  . Not on file  Social Needs  . Financial resource strain: Not on file  . Food insecurity:    Worry: Not on file    Inability: Not on file  . Transportation needs:    Medical: Not on file     Non-medical: Not on file  Tobacco Use  . Smoking status: Never Smoker  . Smokeless tobacco: Never Used  Substance and Sexual Activity  . Alcohol use: No  . Drug use: No  . Sexual activity: Yes    Birth control/protection: Pill  Lifestyle  . Physical activity:    Days per week: Not on file    Minutes per session: Not on file  . Stress: Not on file  Relationships  . Social connections:    Talks on phone: Not on file    Gets together: Not on file    Attends religious service: Not on file    Active member of club or organization: Not on file    Attends meetings of clubs or organizations: Not on file    Relationship status: Not on file  . Intimate partner violence:    Fear of current or ex partner: Not on file    Emotionally abused: Not on file    Physically abused: Not on file    Forced sexual activity: Not on file  Other Topics Concern  . Not on file  Social History Narrative  . Not on file    Family History  Problem Relation Age of Onset  . Healthy Mother   . Cancer Father   . Healthy Sister   . Healthy Brother   . Hypertension Maternal Grandmother      Review  of Systems  Constitutional: Negative.  Negative for chills and fever.  HENT: Positive for sore throat. Negative for congestion.   Eyes: Negative.  Negative for discharge and redness.  Respiratory: Negative.  Negative for cough, hemoptysis and shortness of breath.   Cardiovascular: Negative.  Negative for chest pain and palpitations.  Gastrointestinal: Positive for nausea and vomiting. Negative for abdominal pain, blood in stool and melena.  Genitourinary: Negative.  Negative for dysuria, frequency and hematuria.  Musculoskeletal: Negative.   Skin: Negative.  Negative for rash.  Neurological: Negative.  Negative for dizziness and headaches.  Endo/Heme/Allergies: Negative.   All other systems reviewed and are negative.   Vitals:   02/21/18 1344  BP: 103/67  Pulse: 97  Resp: 16  Temp: 98.8 F (37.1 C)   SpO2: 100%    Physical Exam  Constitutional: She is oriented to person, place, and time. She appears well-developed and well-nourished.  HENT:  Head: Normocephalic and atraumatic.  Right Ear: External ear normal.  Left Ear: External ear normal.  Mouth/Throat: Uvula is midline. Posterior oropharyngeal erythema present. No oropharyngeal exudate, posterior oropharyngeal edema or tonsillar abscesses.  Neck: Normal range of motion. Neck supple. No JVD present. No thyromegaly present.  Cardiovascular: Normal rate, regular rhythm and normal heart sounds.  Pulmonary/Chest: Effort normal and breath sounds normal.  Abdominal: Soft. She exhibits no distension. There is no tenderness.  Musculoskeletal: Normal range of motion. She exhibits no edema.  Lymphadenopathy:    She has cervical adenopathy.  Neurological: She is alert and oriented to person, place, and time. No cranial nerve deficit or sensory deficit. She exhibits normal muscle tone. Coordination normal.  Skin: Skin is warm and dry. Capillary refill takes less than 2 seconds.  Psychiatric: She has a normal mood and affect. Her behavior is normal.  Vitals reviewed.  Results for orders placed or performed in visit on 02/21/18 (from the past 24 hour(s))  POCT urinalysis dipstick     Status: Abnormal   Collection Time: 02/21/18  1:55 PM  Result Value Ref Range   Color, UA yellow yellow   Clarity, UA clear clear   Glucose, UA negative negative mg/dL   Bilirubin, UA negative negative   Ketones, POC UA negative negative mg/dL   Spec Grav, UA 4.098 1.191 - 1.025   Blood, UA negative negative   pH, UA 8.5 (A) 5.0 - 8.0   Protein Ur, POC trace (A) negative mg/dL   Urobilinogen, UA 0.2 0.2 or 1.0 E.U./dL   Nitrite, UA Negative Negative   Leukocytes, UA Trace (A) Negative  POCT urine pregnancy     Status: None   Collection Time: 02/21/18  1:55 PM  Result Value Ref Range   Preg Test, Ur Negative Negative   A total of 25 minutes was spent  in the room with the patient, greater than 50% of which was in counseling/coordination of care regarding differential diagnosis, treatment, medications, and need for follow-up if no better or worse.   ASSESSMENT & PLAN: Robin Floyd was seen today for thyroid glands and nausea.  Diagnoses and all orders for this visit:  Cervical adenopathy -     azithromycin (ZITHROMAX) 250 MG tablet; Sig as indicated  Non-intractable vomiting with nausea, unspecified vomiting type -     POCT urinalysis dipstick -     POCT urine pregnancy   Patient Instructions  Lymphadenopathy Lymphadenopathy refers to swollen or enlarged lymph glands, also called lymph nodes. Lymph glands are part of your body's defense (immune) system, which  protects the body from infections, germs, and diseases. Lymph glands are found in many locations in your body, including the neck, underarm, and groin. Many things can cause lymph glands to become enlarged. When your immune system responds to germs, such as viruses or bacteria, infection-fighting cells and fluid build up. This causes the glands to grow in size. Usually, this is not something to worry about. The swelling and any soreness often go away without treatment. However, swollen lymph glands can also be caused by a number of diseases. Your health care provider may do various tests to help determine the cause. If the cause of your swollen lymph glands cannot be found, it is important to monitor your condition to make sure the swelling goes away. Follow these instructions at home: Watch your condition for any changes. The following actions may help to lessen any discomfort you are feeling:  Get plenty of rest.  Take medicines only as directed by your health care provider. Your health care provider may recommend over-the-counter medicines for pain.  Apply moist heat compresses to the site of swollen lymph nodes as directed by your health care provider. This can help reduce any  pain.  Check your lymph nodes daily for any changes.  Keep all follow-up visits as directed by your health care provider. This is important.  Contact a health care provider if:  Your lymph nodes are still swollen after 2 weeks.  Your swelling increases or spreads to other areas.  Your lymph nodes are hard, seem fixed to the skin, or are growing rapidly.  Your skin over the lymph nodes is red and inflamed.  You have a fever.  You have chills.  You have fatigue.  You develop a sore throat.  You have abdominal pain.  You have weight loss.  You have night sweats. Get help right away if:  You notice fluid leaking from the area of the enlarged lymph node.  You have severe pain in any area of your body.  You have chest pain.  You have shortness of breath. This information is not intended to replace advice given to you by your health care provider. Make sure you discuss any questions you have with your health care provider. Document Released: 01/11/2008 Document Revised: 09/09/2015 Document Reviewed: 11/06/2013 Elsevier Interactive Patient Education  2018 Elsevier Inc.      Edwina Barth, MD [facility]

## 2018-02-21 NOTE — Patient Instructions (Addendum)
Lymphadenopathy Lymphadenopathy refers to swollen or enlarged lymph glands, also called lymph nodes. Lymph glands are part of your body's defense (immune) system, which protects the body from infections, germs, and diseases. Lymph glands are found in many locations in your body, including the neck, underarm, and groin. Many things can cause lymph glands to become enlarged. When your immune system responds to germs, such as viruses or bacteria, infection-fighting cells and fluid build up. This causes the glands to grow in size. Usually, this is not something to worry about. The swelling and any soreness often go away without treatment. However, swollen lymph glands can also be caused by a number of diseases. Your health care provider may do various tests to help determine the cause. If the cause of your swollen lymph glands cannot be found, it is important to monitor your condition to make sure the swelling goes away. Follow these instructions at home: Watch your condition for any changes. The following actions may help to lessen any discomfort you are feeling:  Get plenty of rest.  Take medicines only as directed by your health care provider. Your health care provider may recommend over-the-counter medicines for pain.  Apply moist heat compresses to the site of swollen lymph nodes as directed by your health care provider. This can help reduce any pain.  Check your lymph nodes daily for any changes.  Keep all follow-up visits as directed by your health care provider. This is important.  Contact a health care provider if:  Your lymph nodes are still swollen after 2 weeks.  Your swelling increases or spreads to other areas.  Your lymph nodes are hard, seem fixed to the skin, or are growing rapidly.  Your skin over the lymph nodes is red and inflamed.  You have a fever.  You have chills.  You have fatigue.  You develop a sore throat.  You have abdominal pain.  You have weight  loss.  You have night sweats. Get help right away if:  You notice fluid leaking from the area of the enlarged lymph node.  You have severe pain in any area of your body.  You have chest pain.  You have shortness of breath. This information is not intended to replace advice given to you by your health care provider. Make sure you discuss any questions you have with your health care provider. Document Released: 01/11/2008 Document Revised: 09/09/2015 Document Reviewed: 11/06/2013 Elsevier Interactive Patient Education  2018 Elsevier Inc.  

## 2018-03-08 DIAGNOSIS — N898 Other specified noninflammatory disorders of vagina: Secondary | ICD-10-CM | POA: Diagnosis not present

## 2018-03-08 DIAGNOSIS — N760 Acute vaginitis: Secondary | ICD-10-CM | POA: Diagnosis not present

## 2018-03-08 DIAGNOSIS — Z113 Encounter for screening for infections with a predominantly sexual mode of transmission: Secondary | ICD-10-CM | POA: Diagnosis not present

## 2018-06-11 DIAGNOSIS — Z113 Encounter for screening for infections with a predominantly sexual mode of transmission: Secondary | ICD-10-CM | POA: Diagnosis not present

## 2018-06-11 DIAGNOSIS — N912 Amenorrhea, unspecified: Secondary | ICD-10-CM | POA: Diagnosis not present

## 2018-10-25 DIAGNOSIS — Z01419 Encounter for gynecological examination (general) (routine) without abnormal findings: Secondary | ICD-10-CM | POA: Diagnosis not present

## 2018-10-25 DIAGNOSIS — Z130 Encounter for screening for diseases of the blood and blood-forming organs and certain disorders involving the immune mechanism: Secondary | ICD-10-CM | POA: Diagnosis not present

## 2018-10-25 DIAGNOSIS — Z793 Long term (current) use of hormonal contraceptives: Secondary | ICD-10-CM | POA: Diagnosis not present

## 2018-10-25 DIAGNOSIS — Z124 Encounter for screening for malignant neoplasm of cervix: Secondary | ICD-10-CM | POA: Diagnosis not present

## 2018-10-25 DIAGNOSIS — Z113 Encounter for screening for infections with a predominantly sexual mode of transmission: Secondary | ICD-10-CM | POA: Diagnosis not present

## 2018-10-25 DIAGNOSIS — Z1151 Encounter for screening for human papillomavirus (HPV): Secondary | ICD-10-CM | POA: Diagnosis not present

## 2019-03-29 ENCOUNTER — Encounter (HOSPITAL_COMMUNITY): Payer: Self-pay | Admitting: Emergency Medicine

## 2019-03-29 ENCOUNTER — Ambulatory Visit (HOSPITAL_COMMUNITY)
Admission: EM | Admit: 2019-03-29 | Discharge: 2019-03-29 | Disposition: A | Discharge status: Home / Self Care | Payer: Federal, State, Local not specified - PPO | Attending: Emergency Medicine | Admitting: Emergency Medicine

## 2019-03-29 ENCOUNTER — Other Ambulatory Visit: Payer: Self-pay

## 2019-03-29 DIAGNOSIS — Z79899 Other long term (current) drug therapy: Secondary | ICD-10-CM | POA: Insufficient documentation

## 2019-03-29 DIAGNOSIS — R102 Pelvic and perineal pain: Secondary | ICD-10-CM | POA: Diagnosis not present

## 2019-03-29 DIAGNOSIS — Z793 Long term (current) use of hormonal contraceptives: Secondary | ICD-10-CM | POA: Insufficient documentation

## 2019-03-29 DIAGNOSIS — N939 Abnormal uterine and vaginal bleeding, unspecified: Secondary | ICD-10-CM | POA: Diagnosis not present

## 2019-03-29 DIAGNOSIS — Z3202 Encounter for pregnancy test, result negative: Secondary | ICD-10-CM

## 2019-03-29 LAB — POCT URINALYSIS DIP (DEVICE)
Bilirubin Urine: NEGATIVE
Glucose, UA: NEGATIVE mg/dL
Ketones, ur: NEGATIVE mg/dL
Leukocytes,Ua: NEGATIVE
Nitrite: NEGATIVE
Protein, ur: NEGATIVE mg/dL
Specific Gravity, Urine: 1.025 (ref 1.005–1.030)
Urobilinogen, UA: 0.2 mg/dL (ref 0.0–1.0)
pH: 7.5 (ref 5.0–8.0)

## 2019-03-29 LAB — POC URINE PREG, ED: Preg Test, Ur: NEGATIVE

## 2019-03-29 LAB — POCT PREGNANCY, URINE: Preg Test, Ur: NEGATIVE

## 2019-03-29 NOTE — ED Provider Notes (Signed)
[facility]    CSN: [id number] Arrival date & time: 03/29/19  1523      History   Chief Complaint Chief Complaint  Patient presents with  . Appointment    330  . Vaginal Bleeding    HPI Robin Floyd is a 23 y.o. female.   Robin Floyd presents with complaints of vaginal spotting for the past week. She is on oral birth control and typically has a period every 3 months. Last was 6 weeks ago. They have been regular. Hasn't had to use a pad or tampon but has noted with wiping. Some pelvic cramping. No other urinary symptoms. No other vaginal discharge. Noted to be worse after intercourse. Sexually active with 1 partner and doesn't use condoms. Denies concerns for std's. Denies any increased stress or diet changes. Denies any previous similar. Without contributing medical history.      ROS per HPI, negative if not otherwise mentioned.      Past Medical History:  Diagnosis Date  . Asthma     Patient Active Problem List   Diagnosis Date Noted  . Acute appendicitis 03/09/2016    Past Surgical History:  Procedure Laterality Date  . LAPAROSCOPIC APPENDECTOMY N/A 03/10/2016   Procedure: APPENDECTOMY LAPAROSCOPIC;  Surgeon: Erroll Luna, MD;  Location: [facility];  Service: General;  Laterality: N/A;    OB History   No obstetric history on file.      Home Medications    Prior to Admission medications   Medication Sig Start Date End Date Taking? Authorizing Provider  azithromycin (ZITHROMAX) 250 MG tablet Sig as indicated 02/21/18   Horald Pollen, MD  cetirizine-pseudoephedrine (ZYRTEC-D) 5-120 MG tablet Take 1 tablet by mouth daily. 08/09/17   Law, Bea Graff, PA-C  fluticasone (FLONASE) 50 MCG/ACT nasal spray Place 2 sprays into both nostrils daily. 08/09/17   Frederica Kuster, PA-C  levonorgestrel-ethinyl estradiol (JOLESSA) 0.15-0.03 MG tablet Take 1 tablet by mouth daily.    [provider]    Family History Family History  Problem  Relation Age of Onset  . Healthy Mother   . Cancer Father   . Healthy Sister   . Healthy Brother   . Hypertension Maternal Grandmother     Social History Social History   Tobacco Use  . Smoking status: Never Smoker  . Smokeless tobacco: Never Used  Substance Use Topics  . Alcohol use: No  . Drug use: No     Allergies   Sulfa antibiotics   Review of Systems Review of Systems   Physical Exam Triage Vital Signs ED Triage Vitals  Enc Vitals Group     BP 03/29/19 1546 (!) 138/96     Pulse Rate 03/29/19 1546 79     Resp 03/29/19 1546 18     Temp 03/29/19 1546 98.7 F (37.1 C)     Temp src --      SpO2 03/29/19 1546 100 %     Weight --      Height --      Head Circumference --      Peak Flow --      Pain Score 03/29/19 1548 4     Pain Loc --      Pain Edu? --      Excl. in [place]? --    No data found.  Updated Vital Signs BP (!) 138/96   Pulse 79   Temp 98.7 F (37.1 C)   Resp 18   SpO2 100%  Physical Exam Constitutional:      General: She is not in acute distress.    Appearance: She is well-developed.  Cardiovascular:     Rate and Rhythm: Normal rate.  Pulmonary:     Effort: Pulmonary effort is normal.  Genitourinary:    Cervix: Cervical bleeding present.     Comments: Noted small dark red bleeding from cervix; otherwise normal exam  Skin:    General: Skin is warm and dry.  Neurological:     Mental Status: She is alert and oriented to person, place, and time.      UC Treatments / Results  Labs (all labs ordered are listed, but only abnormal results are displayed) Labs Reviewed  POCT URINALYSIS DIP (DEVICE) - Abnormal; Notable for the following components:      Result Value   Hgb urine dipstick TRACE (*)    All other components within normal limits  POC URINE PREG, ED  POCT PREGNANCY, URINE  CERVICOVAGINAL ANCILLARY ONLY    EKG   Radiology No results found.  Procedures Procedures (including critical care time)  Medications  Ordered in UC Medications - No data to display  Initial Impression / Assessment and Plan / UC Course  I have reviewed the triage vital signs and the nursing notes.  Pertinent labs & imaging results that were available during my care of the patient were reviewed by me and considered in my medical decision making (see chart for details).     Breakthrough vaginal bleeding. Otherwise normal exam. Vitals normal. Normal ua and negative pregnancy. Vaginal cytology collected and pending. Return precautions provided.  Encouraged follow up with gyne prn. Patient verbalized understanding and agreeable to plan.   Final Clinical Impressions(s) / UC Diagnoses   Final diagnoses:  Abnormal vaginal bleeding     Discharge Instructions     Your urine looks well today, negative pregnancy.  Continue with your birth control.  Ibuprofen or aleve as needed.  Stay hydrated.  Will notify of any positive findings from your vaginal swab and if any changes to treatment are needed.   Please follow up with your gynecologist if persistent. Go to ER if becomes heavy and you have increased pain or dizziness.    ED Prescriptions    None     PDMP not reviewed this encounter.   Georgetta Haber, NP 03/29/19 1735

## 2019-03-29 NOTE — Discharge Instructions (Signed)
Your urine looks well today, negative pregnancy.  Continue with your birth control.  Ibuprofen or aleve as needed.  Stay hydrated.  Will notify of any positive findings from your vaginal swab and if any changes to treatment are needed.   Please follow up with your gynecologist if persistent. Go to ER if becomes heavy and you have increased pain or dizziness.

## 2019-03-29 NOTE — ED Triage Notes (Signed)
Pt states shes on birth control, she gets her period once every three months. States her last period was 6 weeks ago. States she noticed spotting around Monday and mild cramping.

## 2019-04-01 ENCOUNTER — Telehealth (HOSPITAL_COMMUNITY): Payer: Self-pay | Admitting: Emergency Medicine

## 2019-04-01 LAB — CERVICOVAGINAL ANCILLARY ONLY
Bacterial vaginitis: POSITIVE — AB
Candida vaginitis: NEGATIVE
Chlamydia: NEGATIVE
Neisseria Gonorrhea: NEGATIVE
Trichomonas: NEGATIVE

## 2019-04-01 MED ORDER — METRONIDAZOLE 500 MG PO TABS: 500.0000 mg | ORAL_TABLET | Freq: Two times a day (BID) | ORAL | 0 refills | Status: AC

## 2019-04-01 NOTE — Telephone Encounter (Signed)
Bacterial vaginosis is positive. This was not treated at the urgent care visit.  Flagyl 500 mg BID x 7 days #14 no refills sent to patients pharmacy of choice.    Patient contacted by phone and made aware of    results. Pt verbalized understanding and had all questions answered.    

## 2019-04-08 DIAGNOSIS — N926 Irregular menstruation, unspecified: Secondary | ICD-10-CM | POA: Diagnosis not present

## 2019-04-08 DIAGNOSIS — N930 Postcoital and contact bleeding: Secondary | ICD-10-CM | POA: Diagnosis not present

## 2019-04-09 DIAGNOSIS — N930 Postcoital and contact bleeding: Secondary | ICD-10-CM | POA: Diagnosis not present

## 2019-06-10 DIAGNOSIS — N926 Irregular menstruation, unspecified: Secondary | ICD-10-CM | POA: Diagnosis not present

## 2019-12-19 DIAGNOSIS — Z113 Encounter for screening for infections with a predominantly sexual mode of transmission: Secondary | ICD-10-CM | POA: Diagnosis not present

## 2020-01-16 DIAGNOSIS — Z793 Long term (current) use of hormonal contraceptives: Secondary | ICD-10-CM | POA: Diagnosis not present

## 2020-01-16 DIAGNOSIS — Z130 Encounter for screening for diseases of the blood and blood-forming organs and certain disorders involving the immune mechanism: Secondary | ICD-10-CM | POA: Diagnosis not present

## 2020-01-16 DIAGNOSIS — Z1389 Encounter for screening for other disorder: Secondary | ICD-10-CM | POA: Diagnosis not present

## 2020-01-16 DIAGNOSIS — Z01419 Encounter for gynecological examination (general) (routine) without abnormal findings: Secondary | ICD-10-CM | POA: Diagnosis not present

## 2020-02-20 DIAGNOSIS — N921 Excessive and frequent menstruation with irregular cycle: Secondary | ICD-10-CM | POA: Diagnosis not present

## 2020-02-20 DIAGNOSIS — N840 Polyp of corpus uteri: Secondary | ICD-10-CM | POA: Diagnosis not present

## 2020-06-01 ENCOUNTER — Encounter (HOSPITAL_BASED_OUTPATIENT_CLINIC_OR_DEPARTMENT_OTHER): Payer: Self-pay | Admitting: Obstetrics and Gynecology

## 2020-06-01 ENCOUNTER — Other Ambulatory Visit: Payer: Self-pay

## 2020-06-01 NOTE — Progress Notes (Signed)
Spoke w/ via phone for pre-op interview--- PT Lab needs dos---- Urine preg (per anes)/  Pre-op orders pending              Lab results------ no COVID test ------ 06-04-2020 @ 1155 Arrive at ------- 0530 on 06-07-2020 NPO after MN NO Solid Food.  Clear liquids from MN until--- 0430 Medications to take morning of surgery ----- NONE Diabetic medication ----- n/a Patient Special Instructions ----- n/a Pre-Op special Istructions ----- sent inbox message in epic to dr shivaji requested orders Patient verbalized understanding of instructions that were given at this phone interview. Patient denies shortness of breath, chest pain, fever, cough at this phone interview.

## 2020-06-04 ENCOUNTER — Other Ambulatory Visit (HOSPITAL_COMMUNITY)
Admission: RE | Admit: 2020-06-04 | Discharge: 2020-06-04 | Disposition: A | Discharge status: Home / Self Care | Payer: Federal, State, Local not specified - PPO | Source: Ambulatory Visit | Attending: Obstetrics and Gynecology | Admitting: Obstetrics and Gynecology

## 2020-06-04 DIAGNOSIS — Z01812 Encounter for preprocedural laboratory examination: Secondary | ICD-10-CM | POA: Insufficient documentation

## 2020-06-04 DIAGNOSIS — N840 Polyp of corpus uteri: Secondary | ICD-10-CM | POA: Diagnosis not present

## 2020-06-04 DIAGNOSIS — Z882 Allergy status to sulfonamides status: Secondary | ICD-10-CM | POA: Diagnosis not present

## 2020-06-04 DIAGNOSIS — Z20822 Contact with and (suspected) exposure to covid-19: Secondary | ICD-10-CM | POA: Insufficient documentation

## 2020-06-05 LAB — SARS CORONAVIRUS 2 (TAT 6-24 HRS): SARS Coronavirus 2: NEGATIVE

## 2020-06-06 NOTE — Anesthesia Preprocedure Evaluation (Addendum)
Anesthesia Evaluation  Patient identified by MRN, date of birth, ID band Patient awake    Reviewed: Allergy & Precautions  Airway Mallampati: II  TM Distance: >3 FB     Dental   Pulmonary    breath sounds clear to auscultation       Cardiovascular negative cardio ROS   Rhythm:Regular Rate:Normal     Neuro/Psych    GI/Hepatic negative GI ROS, Neg liver ROS,   Endo/Other  negative endocrine ROS  Renal/GU negative Renal ROS     Musculoskeletal   Abdominal   Peds  Hematology negative hematology ROS (+)   Anesthesia Other Findings   Reproductive/Obstetrics History noted CG                            Anesthesia Physical Anesthesia Plan  ASA: II  Anesthesia Plan: General   Post-op Pain Management:    Induction:   PONV Risk Score and Plan: Ondansetron, Dexamethasone and Midazolam  Airway Management Planned: LMA  Additional Equipment:   Intra-op Plan:   Post-operative Plan: Extubation in OR  Informed Consent: I have reviewed the patients History and Physical, chart, labs and discussed the procedure including the risks, benefits and alternatives for the proposed anesthesia with the patient or authorized representative who has indicated his/her understanding and acceptance.     Dental advisory given  Plan Discussed with: Anesthesiologist and CRNA  Anesthesia Plan Comments:       Anesthesia Quick Evaluation

## 2020-06-06 NOTE — H&P (Signed)
Robin Floyd is an 25 y.o. female presenting for scheduled surgery.  Pertinent Gynecological History: Menses: regular every month with spotting approximately 5-6 days per month Bleeding: intermenstrual bleeding Contraception: OCP (estrogen/progesterone) DES exposure: denies Blood transfusions: none Sexually transmitted diseases: past history: gonorrhea, chlamydia (2017, 2019) Previous GYN Procedures: none  Last mammogram: TOO YOUNG Last pap: normal Date: 10/25/2018 OB History: G0, P0   Menstrual History: Menarche age: 25 Patient's last menstrual period was 05/18/2020 (approximate).    Past Medical History:  Diagnosis Date  . Polyp of corpus uteri   . Wears contact lenses     Past Surgical History:  Procedure Laterality Date  . LAPAROSCOPIC APPENDECTOMY N/A 03/10/2016   Procedure: APPENDECTOMY LAPAROSCOPIC;  Surgeon: Harriette Bouillon, MD;  Location: [facility] OR;  Service: General;  Laterality: N/A;    Family History  Problem Relation Age of Onset  . Healthy Mother   . Cancer Father   . Healthy Sister   . Healthy Brother   . Hypertension Maternal Grandmother     Social History:  reports that she has never smoked. She has never used smokeless tobacco. She reports current alcohol use. She reports that she does not use drugs.  Allergies:  Allergies  Allergen Reactions  . Sulfa Antibiotics Hives    No medications prior to admission.    Review of Systems  Constitutional: Negative for chills and fever.  Respiratory: Negative for shortness of breath.   Cardiovascular: Negative for chest pain, palpitations and leg swelling.  Gastrointestinal: Negative for abdominal pain, nausea and vomiting.  Neurological: Negative for dizziness, weakness and headaches.  Psychiatric/Behavioral: Negative for suicidal ideas.    Height 5\' 6"  (1.676 m), weight 57.2 kg, last menstrual period 05/18/2020. Physical Exam Gen: NAD CV: CTAB, RRR Abd: Soft NTTP MSK: neg calf edema/Homan's BL Psych:  WNL  No results found for this or any previous visit (from the past 24 hour(s)).  No results found.  Assessment/Plan: This is a 25yo G0 on COCs presenting for surgical management of AUB. In-office workup includes SIUS done on 02/20/2020 showing 27mm possible polyp. Patient desires surgical management of issue. The patient was informed of the risks and benefits of a hysteroscopy with dilation and curettage. Risks included but were not limited to bleeidng, infections, injury to the vulva, vagina or cerivx, or uterine perforation. If concern for latter, may proceed with diagnostic laparoscopy for evaluation of injury which may require surgical repair. Patient understands and is amenable.   10m [name] 06/06/2020, 8:55 AM

## 2020-06-07 ENCOUNTER — Encounter (HOSPITAL_BASED_OUTPATIENT_CLINIC_OR_DEPARTMENT_OTHER): Payer: Self-pay | Admitting: Obstetrics and Gynecology

## 2020-06-07 ENCOUNTER — Ambulatory Visit (HOSPITAL_BASED_OUTPATIENT_CLINIC_OR_DEPARTMENT_OTHER): Payer: Federal, State, Local not specified - PPO | Admitting: Anesthesiology

## 2020-06-07 ENCOUNTER — Ambulatory Visit (HOSPITAL_BASED_OUTPATIENT_CLINIC_OR_DEPARTMENT_OTHER)
Admission: RE | Admit: 2020-06-07 | Discharge: 2020-06-07 | Disposition: A | Discharge status: Home / Self Care | Payer: Federal, State, Local not specified - PPO | Attending: Obstetrics and Gynecology | Admitting: Obstetrics and Gynecology

## 2020-06-07 ENCOUNTER — Encounter (HOSPITAL_BASED_OUTPATIENT_CLINIC_OR_DEPARTMENT_OTHER)
Admission: RE | Disposition: A | Discharge status: Home / Self Care | Payer: Self-pay | Source: Home / Self Care | Attending: Obstetrics and Gynecology

## 2020-06-07 DIAGNOSIS — N840 Polyp of corpus uteri: Secondary | ICD-10-CM | POA: Insufficient documentation

## 2020-06-07 DIAGNOSIS — Z20822 Contact with and (suspected) exposure to covid-19: Secondary | ICD-10-CM | POA: Insufficient documentation

## 2020-06-07 DIAGNOSIS — Z882 Allergy status to sulfonamides status: Secondary | ICD-10-CM | POA: Insufficient documentation

## 2020-06-07 HISTORY — PX: DILATATION & CURETTAGE/HYSTEROSCOPY WITH MYOSURE: SHX6511

## 2020-06-07 HISTORY — DX: Polyp of corpus uteri: N84.0

## 2020-06-07 HISTORY — DX: Presence of spectacles and contact lenses: Z97.3

## 2020-06-07 LAB — TYPE AND SCREEN
ABO/RH(D): A POS
Antibody Screen: NEGATIVE

## 2020-06-07 LAB — ABO/RH: ABO/RH(D): A POS

## 2020-06-07 LAB — POCT PREGNANCY, URINE: Preg Test, Ur: NEGATIVE

## 2020-06-07 SURGERY — DILATATION & CURETTAGE/HYSTEROSCOPY WITH MYOSURE
Anesthesia: General

## 2020-06-07 MED ORDER — ONDANSETRON HCL 4 MG/2ML IJ SOLN
INTRAMUSCULAR | Status: DC | PRN
  Administered 2020-06-07: 4 mg via INTRAVENOUS

## 2020-06-07 MED ORDER — PROPOFOL 10 MG/ML IV BOLUS
INTRAVENOUS | Status: AC
  Filled 2020-06-07: qty 20

## 2020-06-07 MED ORDER — MIDAZOLAM HCL 5 MG/5ML IJ SOLN
INTRAMUSCULAR | Status: DC | PRN
  Administered 2020-06-07: 2 mg via INTRAVENOUS

## 2020-06-07 MED ORDER — KETOROLAC TROMETHAMINE 30 MG/ML IJ SOLN
INTRAMUSCULAR | Status: DC | PRN
  Administered 2020-06-07: 30 mg via INTRAVENOUS

## 2020-06-07 MED ORDER — FENTANYL CITRATE (PF) 100 MCG/2ML IJ SOLN
INTRAMUSCULAR | Status: AC
  Filled 2020-06-07: qty 2

## 2020-06-07 MED ORDER — DEXAMETHASONE SODIUM PHOSPHATE 10 MG/ML IJ SOLN
INTRAMUSCULAR | Status: DC | PRN
  Administered 2020-06-07: 10 mg via INTRAVENOUS

## 2020-06-07 MED ORDER — KETOROLAC TROMETHAMINE 30 MG/ML IJ SOLN
INTRAMUSCULAR | Status: AC
  Filled 2020-06-07: qty 1

## 2020-06-07 MED ORDER — IBUPROFEN 800 MG PO TABS: 800.0000 mg | ORAL_TABLET | Freq: Three times a day (TID) | ORAL | 0 refills | Status: AC | PRN

## 2020-06-07 MED ORDER — SODIUM CHLORIDE 0.9 % IR SOLN
Status: DC | PRN
  Administered 2020-06-07: 3000 mL

## 2020-06-07 MED ORDER — PROPOFOL 10 MG/ML IV BOLUS
INTRAVENOUS | Status: DC | PRN
  Administered 2020-06-07: 200 mg via INTRAVENOUS

## 2020-06-07 MED ORDER — FENTANYL CITRATE (PF) 100 MCG/2ML IJ SOLN
INTRAMUSCULAR | Status: DC | PRN
  Administered 2020-06-07: 50 ug via INTRAVENOUS

## 2020-06-07 MED ORDER — LIDOCAINE 2% (20 MG/ML) 5 ML SYRINGE
INTRAMUSCULAR | Status: DC | PRN
  Administered 2020-06-07: 80 mg via INTRAVENOUS

## 2020-06-07 MED ORDER — LIDOCAINE HCL 1 % IJ SOLN
INTRAMUSCULAR | Status: DC | PRN
  Administered 2020-06-07: 10 mL

## 2020-06-07 MED ORDER — DEXAMETHASONE SODIUM PHOSPHATE 10 MG/ML IJ SOLN
INTRAMUSCULAR | Status: AC
  Filled 2020-06-07: qty 1

## 2020-06-07 MED ORDER — MIDAZOLAM HCL 2 MG/2ML IJ SOLN
INTRAMUSCULAR | Status: AC
  Filled 2020-06-07: qty 2

## 2020-06-07 MED ORDER — LACTATED RINGERS IV SOLN: INTRAVENOUS | Status: DC

## 2020-06-07 MED ORDER — ONDANSETRON HCL 4 MG/2ML IJ SOLN
INTRAMUSCULAR | Status: AC
  Filled 2020-06-07: qty 2

## 2020-06-07 MED ORDER — POVIDONE-IODINE 10 % EX SWAB: 2.0000 "application " | Freq: Once | CUTANEOUS | Status: DC

## 2020-06-07 MED ORDER — FENTANYL CITRATE (PF) 100 MCG/2ML IJ SOLN
25.0000 ug | INTRAMUSCULAR | Status: DC | PRN
  Administered 2020-06-07: 25 ug via INTRAVENOUS

## 2020-06-07 MED ORDER — LIDOCAINE HCL (PF) 2 % IJ SOLN
INTRAMUSCULAR | Status: AC
  Filled 2020-06-07: qty 5

## 2020-06-07 SURGICAL SUPPLY — 14 items
CANISTER SUCT 3000ML PPV (MISCELLANEOUS) ×2 IMPLANT
CATH ROBINSON RED A/P 16FR (CATHETERS) ×2 IMPLANT
DEVICE MYOSURE LITE (MISCELLANEOUS) ×2 IMPLANT
DEVICE MYOSURE REACH (MISCELLANEOUS) IMPLANT
GLOVE SURG LTX SZ6.5 (GLOVE) ×2 IMPLANT
GLOVE SURG UNDER POLY LF SZ6.5 (GLOVE) ×2 IMPLANT
GLOVE SURG UNDER POLY LF SZ7 (GLOVE) ×2 IMPLANT
GOWN STRL REUS W/TWL LRG LVL3 (GOWN DISPOSABLE) ×4 IMPLANT
KIT PROCEDURE FLUENT (KITS) ×2 IMPLANT
KIT TURNOVER CYSTO (KITS) ×2 IMPLANT
PACK VAGINAL MINOR WOMEN LF (CUSTOM PROCEDURE TRAY) ×2 IMPLANT
PAD OB MATERNITY 4.3X12.25 (PERSONAL CARE ITEMS) ×2 IMPLANT
SEAL ROD LENS SCOPE MYOSURE (ABLATOR) ×2 IMPLANT
TOWEL OR 17X26 10 PK STRL BLUE (TOWEL DISPOSABLE) ×2 IMPLANT

## 2020-06-07 NOTE — Anesthesia Postprocedure Evaluation (Signed)
Anesthesia Post Note  Patient: Robin Floyd  Procedure(s) Performed: DILATATION & CURETTAGE/HYSTEROSCOPY WITH MYOSURE POLYPECTOMY (N/A )     Patient location during evaluation: PACU Anesthesia Type: General Level of consciousness: awake Pain management: pain level controlled Vital Signs Assessment: post-procedure vital signs reviewed and stable Respiratory status: spontaneous breathing Cardiovascular status: stable Postop Assessment: no apparent nausea or vomiting Anesthetic complications: no   No complications documented.  Last Vitals:  Vitals:   06/07/20 0830 06/07/20 0845  BP: 127/89 123/81  Pulse: 85 79  Resp: 18 18  Temp:    SpO2: 100% 99%    Last Pain:  Vitals:   06/07/20 0845  TempSrc:   PainSc: 3                  Nikos Anglemyer

## 2020-06-07 NOTE — Discharge Instructions (Signed)
  Post Anesthesia Home Care Instructions  Activity: Get plenty of rest for the remainder of the day. A responsible individual must stay with you for 24 hours following the procedure.  For the next 24 hours, DO NOT: -Drive a car -Operate machinery -Drink alcoholic beverages -Take any medication unless instructed by your physician -Make any legal decisions or sign important papers.  Meals: Start with liquid foods such as gelatin or soup. Progress to regular foods as tolerated. Avoid greasy, spicy, heavy foods. If nausea and/or vomiting occur, drink only clear liquids until the nausea and/or vomiting subsides. Call your physician if vomiting continues.  Special Instructions/Symptoms: Your throat may feel dry or sore from the anesthesia or the breathing tube placed in your throat during surgery. If this causes discomfort, gargle with warm salt water. The discomfort should disappear within 24 hours.  DISCHARGE INSTRUCTIONS: D&C / D&E The following instructions have been prepared to help you care for yourself upon your return home.   Personal hygiene: . Use sanitary pads for vaginal drainage, not tampons. . Shower the day after your procedure. . NO tub baths, pools or Jacuzzis for 2-3 weeks. . Wipe front to back after using the bathroom.  Activity and limitations: . Do NOT drive or operate any equipment for 24 hours. The effects of anesthesia are still present and drowsiness may result. . Do NOT rest in bed all day. . Walking is encouraged. . Walk up and down stairs slowly. . You may resume your normal activity in one to two days or as indicated by your physician.  Sexual activity: NO intercourse for at least 2 weeks after the procedure, or as indicated by your physician.  Diet: Eat a light meal as desired this evening. You may resume your usual diet tomorrow.  Return to work: You may resume your work activities in one to two days or as indicated by your doctor.  What to expect after  your surgery: Expect to have vaginal bleeding/discharge for 2-3 days and spotting for up to 10 days. It is not unusual to have soreness for up to 1-2 weeks. You may have a slight burning sensation when you urinate for the first day. Mild cramps may continue for a couple of days. You may have a regular period in 2-6 weeks.  Call your doctor for any of the following: . Excessive vaginal bleeding, saturating and changing one pad every hour. . Inability to urinate 6 hours after discharge from hospital. . Pain not relieved by pain medication. . Fever of 100.4 F or greater. . Unusual vaginal discharge or odor.      

## 2020-06-07 NOTE — Op Note (Signed)
06/07/20   Surgeon: Eula Flax, MD Preoperative Diagnoses: Possible symptomatic endometrial polyp \ Postoperative Diagnoses: Same as above   Procedures performed:  1) Operative hysteroscopy 2) Myosure resection of endometrial polyp   IVF: 700cc LR EBL: <10cc UOP: 50cc clear yellow urine via red rubber  Fluid deficit: 240cc   Anesthesia: LMA  Findings: External genitalia WNL. Upon insertion of Myosure scope, polypoid pale tissue projections at 10 and 4 o clock noted within LUS. BL ostia appear patent, rest of endometrium consistent with OCP use. At midline of uterine fundus, appearance of three symmetric, equidistant indentations within thick tissue - possible septum vs fundal adhesions. No active bleeding noted with reduced intrauterine pressure no evidence of bubbles, low concern for possible perforation   Indications: Robin Floyd is a 25yo G0P0 with AUB-IMB. Preoperative imaging via TVUS and SIUS shows 83m likely polyp within LUS, no vascular flow. Pt desires surgical management of AUB-IMB (failed management on OCPs alone). Consented for above procedures   RBA:  The patient was informed of the risks and benefits of a hysteroscopy with dilation and curettage. Risks included but were not limited to bleeidng, infections, injury to the vulva, vagina or cerivx, or uterine perforation. If concern for latter, may proceed with diagnostic laparoscopy for evaluation of injury which may require surgical repair. Patient understands and is amenable.      Operative details: Patient was taken to operating room where general anesthesia via LMA was established. Placed in dorsal lithotomy with appropriate padded points. No antibiotics given preop via ACOG recommendations. Time out was performed after vaginal prep was carried out with Betadine. Tenaculum then placed on anterior cervical lip. Pratt dilators used (17) to gently dilate internal os with no resistance met. Upon insertion of Myosure hysteroscope,  findings noted as above. Myosure resection carried out until visible pathology was removed. Scope withdrawn, tenaculum removed. Bleeding controlled with pressure. All instruments removed from vagina.   Fluid deficit controlled throughout, final deficit as above  Patient tolerated procedure well. All counts correct at end of procedure

## 2020-06-07 NOTE — Anesthesia Procedure Notes (Signed)
Procedure Name: LMA Insertion Date/Time: 06/07/2020 7:40 AM Performed by: Bishop Limbo, CRNA Pre-anesthesia Checklist: Patient identified, Emergency Drugs available, Suction available and Patient being monitored Patient Re-evaluated:Patient Re-evaluated prior to induction Oxygen Delivery Method: Circle System Utilized Preoxygenation: Pre-oxygenation with 100% oxygen Induction Type: IV induction Ventilation: Mask ventilation without difficulty LMA: LMA inserted LMA Size: 4.0 Number of attempts: 1 Placement Confirmation: positive ETCO2 Tube secured with: Tape Dental Injury: Teeth and Oropharynx as per pre-operative assessment

## 2020-06-07 NOTE — Interval H&P Note (Signed)
History and Physical Interval Note:  06/07/2020 7:31 AM  Robin Floyd  has presented today for surgery, with the diagnosis of polyp of corpus uteri.  The various methods of treatment have been discussed with the patient and family. After consideration of risks, benefits and other options for treatment, the patient has consented to  Procedure(s): DILATATION & CURETTAGE/HYSTEROSCOPY WITH MYOSURE (N/A) as a surgical intervention.  The patient's history has been reviewed, patient examined, no change in status, stable for surgery.  I have reviewed the patient's chart and labs.  Questions were answered to the patient's satisfaction.     Carsen Leaf M Nicco Reaume

## 2020-06-07 NOTE — Transfer of Care (Signed)
Immediate Anesthesia Transfer of Care Note  Patient: Robin Floyd  Procedure(s) Performed: DILATATION & CURETTAGE/HYSTEROSCOPY WITH MYOSURE POLYPECTOMY (N/A )  Patient Location: PACU  Anesthesia Type:General  Level of Consciousness: awake, alert , oriented and patient cooperative  Airway & Oxygen Therapy: Patient Spontanous Breathing  Post-op Assessment: Report given to RN and Post -op Vital signs reviewed and stable  Post vital signs: Reviewed and stable  Last Vitals:  Vitals Value Taken Time  BP 129/90 06/07/20 0815  Temp    Pulse 97 06/07/20 0816  Resp 20 06/07/20 0816  SpO2 97 % 06/07/20 0816  Vitals shown include unvalidated device data.  Last Pain:  Vitals:   06/07/20 0550  TempSrc: Oral  PainSc: 0-No pain      Patients Stated Pain Goal: 6 (06/07/20 0550)  Complications: No complications documented.

## 2020-06-08 ENCOUNTER — Encounter (HOSPITAL_BASED_OUTPATIENT_CLINIC_OR_DEPARTMENT_OTHER): Payer: Self-pay | Admitting: Obstetrics and Gynecology

## 2020-06-08 LAB — SURGICAL PATHOLOGY

## 2020-07-07 ENCOUNTER — Other Ambulatory Visit: Payer: Self-pay

## 2020-07-07 ENCOUNTER — Ambulatory Visit
Admission: EM | Admit: 2020-07-07 | Discharge: 2020-07-07 | Disposition: A | Discharge status: Home / Self Care | Payer: Federal, State, Local not specified - PPO

## 2020-07-07 DIAGNOSIS — S61210A Laceration without foreign body of right index finger without damage to nail, initial encounter: Secondary | ICD-10-CM | POA: Diagnosis not present

## 2020-07-07 NOTE — Discharge Instructions (Signed)
Keep area clean and dry Follow-up for any signs of infection

## 2020-07-07 NOTE — ED Triage Notes (Signed)
Pt states cut the tip of rt index finger with a knife at 10an. band aide applied bleeding controlled.

## 2020-07-08 NOTE — ED Provider Notes (Signed)
[facility]    CSN: [id number] Arrival date & time: 07/07/20  1234      History   Chief Complaint Chief Complaint  Patient presents with  . Laceration    HPI Robin Floyd is a 25 y.o. female presenting today for evaluation of right index finger laceration.  Reports earlier today accidentally cut tip of finger with a knife.  Reports tetanus up-to-date within the past 5 years.  Denies difficulty bending finger.  HPI  Past Medical History:  Diagnosis Date  . Polyp of corpus uteri   . Wears contact lenses     Patient Active Problem List   Diagnosis Date Noted  . Acute appendicitis 03/09/2016    Past Surgical History:  Procedure Laterality Date  . DILATATION & CURETTAGE/HYSTEROSCOPY WITH MYOSURE N/A 06/07/2020   Procedure: DILATATION & CURETTAGE/HYSTEROSCOPY WITH MYOSURE POLYPECTOMY;  Surgeon: Carlisle Cater, MD;  Location: [facility];  Service: Gynecology;  Laterality: N/A;  . LAPAROSCOPIC APPENDECTOMY N/A 03/10/2016   Procedure: APPENDECTOMY LAPAROSCOPIC;  Surgeon: Harriette Bouillon, MD;  Location: [facility] OR;  Service: General;  Laterality: N/A;    OB History   No obstetric history on file.      Home Medications    Prior to Admission medications   Medication Sig Start Date End Date Taking? Authorizing Provider  ibuprofen (ADVIL) 800 MG tablet Take 1 tablet (800 mg total) by mouth every 8 (eight) hours as needed. 06/07/20   Shivaji, Valerie Roys, MD  levonorgestrel-ethinyl estradiol (SEASONALE) 0.15-0.03 MG tablet Take 1 tablet by mouth at bedtime.    [provider]    Family History Family History  Problem Relation Age of Onset  . Healthy Mother   . Cancer Father   . Healthy Sister   . Healthy Brother   . Hypertension Maternal Grandmother     Social History Social History   Tobacco Use  . Smoking status: Never Smoker  . Smokeless tobacco: Never Used  Vaping Use  . Vaping Use: Never used  Substance Use Topics  . Alcohol  use: Yes    Comment: occasional  . Drug use: Never     Allergies   Sulfa antibiotics   Review of Systems Review of Systems  Constitutional: Negative for fatigue and fever.  Eyes: Negative for visual disturbance.  Respiratory: Negative for shortness of breath.   Cardiovascular: Negative for chest pain.  Gastrointestinal: Negative for abdominal pain, nausea and vomiting.  Musculoskeletal: Negative for arthralgias and joint swelling.  Skin: Negative for color change, rash and wound.  Neurological: Negative for dizziness, weakness, light-headedness and headaches.     Physical Exam Triage Vital Signs ED Triage Vitals  Enc Vitals Group     BP 07/07/20 1445 130/71     Pulse Rate 07/07/20 1445 83     Resp 07/07/20 1445 18     Temp 07/07/20 1445 99.4 F (37.4 C)     Temp Source 07/07/20 1445 Oral     SpO2 07/07/20 1445 97 %     Weight --      Height --      Head Circumference --      Peak Flow --      Pain Score 07/07/20 1446 8     Pain Loc --      Pain Edu? --      Excl. in GC? --    No data found.  Updated Vital Signs BP 130/71 (BP Location: Left Arm)   Pulse 83  Temp 99.4 F (37.4 C) (Oral)   Resp 18   LMP 06/14/2020   SpO2 97%   Visual Acuity Right Eye Distance:   Left Eye Distance:   Bilateral Distance:    Right Eye Near:   Left Eye Near:    Bilateral Near:     Physical Exam Vitals and nursing note reviewed.  Constitutional:      Appearance: She is well-developed.     Comments: No acute distress  HENT:     Head: Normocephalic and atraumatic.     Nose: Nose normal.  Eyes:     Conjunctiva/sclera: Conjunctivae normal.  Cardiovascular:     Rate and Rhythm: Normal rate.  Pulmonary:     Effort: Pulmonary effort is normal. No respiratory distress.  Abdominal:     General: There is no distension.  Musculoskeletal:        General: Normal range of motion.     Cervical back: Neck supple.     Comments: Full active range of motion of right index  finger  Skin:    General: Skin is warm and dry.     Comments: U shaped laceration noted to palmar surface of right index finger, edges well reapproximated, bleeding controlled  Neurological:     Mental Status: She is alert and oriented to person, place, and time.      UC Treatments / Results  Labs (all labs ordered are listed, but only abnormal results are displayed) Labs Reviewed - No data to display  EKG   Radiology No results found.  Procedures Procedures (including critical care time)  Medications Ordered in UC Medications - No data to display  Initial Impression / Assessment and Plan / UC Course  I have reviewed the triage vital signs and the nursing notes.  Pertinent labs & imaging results that were available during my care of the patient were reviewed by me and considered in my medical decision making (see chart for details).     Wound cleansed with soap and water, applied Dermabond topically; discussed wound care.  Continue to monitor.  Discussed strict return precautions. Patient verbalized understanding and is agreeable with plan.  Final Clinical Impressions(s) / UC Diagnoses   Final diagnoses:  Laceration of right index finger without foreign body without damage to nail, initial encounter     Discharge Instructions     Keep area clean and dry Follow-up for any signs of infection     ED Prescriptions    None     PDMP not reviewed this encounter.   Lew Dawes, [place] 07/08/20 [phone]

## 2020-12-16 ENCOUNTER — Other Ambulatory Visit: Payer: Self-pay

## 2020-12-16 ENCOUNTER — Encounter: Payer: Self-pay | Admitting: Emergency Medicine

## 2020-12-16 ENCOUNTER — Ambulatory Visit
Admission: EM | Admit: 2020-12-16 | Discharge: 2020-12-16 | Disposition: A | Discharge status: Home / Self Care | Payer: Federal, State, Local not specified - PPO | Attending: Internal Medicine | Admitting: Internal Medicine

## 2020-12-16 DIAGNOSIS — H1031 Unspecified acute conjunctivitis, right eye: Secondary | ICD-10-CM

## 2020-12-16 MED ORDER — OFLOXACIN 0.3 % OP SOLN: 1.0000 [drp] | Freq: Four times a day (QID) | OPHTHALMIC | 0 refills | Status: AC

## 2020-12-16 NOTE — Discharge Instructions (Addendum)
You are being treated with ofloxacin eyedrops to treat right eye infection.  Please follow-up if blurry vision occurs or if symptoms do not improve.

## 2020-12-16 NOTE — ED Triage Notes (Signed)
Patient states that she woke up on Tuesday with her right eye red and swollen.  Some itching, drainage at night from the eye.  Patient does wear glasses and contacts.  Tried OTC eye drops w/o relief.

## 2020-12-16 NOTE — ED Provider Notes (Signed)
[facility]    CSN: [id number] Arrival date & time: 12/16/20  1247      History   Chief Complaint Chief Complaint  Patient presents with   Eye Problem    HPI Robin Floyd is a 25 y.o. female.   Patient presents with right eye redness and swelling that has been present for approximately 2 days.  Patient has had some purulent drainage coming from eye as well.  Also has some itching but denies pain.  Denies any blurry vision.  Patient does wear glasses and contacts.  Has not been wearing contacts since swelling occurred.  Has tried over-the-counter eyedrops with no relief.  Denies any injury to the eye or any foreign body in eye.   Eye Problem  Past Medical History:  Diagnosis Date   Polyp of corpus uteri    Wears contact lenses     Patient Active Problem List   Diagnosis Date Noted   Acute appendicitis 03/09/2016    Past Surgical History:  Procedure Laterality Date   DILATATION & CURETTAGE/HYSTEROSCOPY WITH MYOSURE N/A 06/07/2020   Procedure: DILATATION & CURETTAGE/HYSTEROSCOPY WITH MYOSURE POLYPECTOMY;  Surgeon: Carlisle Cater, MD;  Location: [facility];  Service: Gynecology;  Laterality: N/A;   LAPAROSCOPIC APPENDECTOMY N/A 03/10/2016   Procedure: APPENDECTOMY LAPAROSCOPIC;  Surgeon: Harriette Bouillon, MD;  Location: [facility];  Service: General;  Laterality: N/A;    OB History   No obstetric history on file.      Home Medications    Prior to Admission medications   Medication Sig Start Date End Date Taking? Authorizing Provider  levonorgestrel-ethinyl estradiol (SEASONALE) 0.15-0.03 MG tablet Take 1 tablet by mouth at bedtime.   Yes [provider]  ofloxacin (OCUFLOX) 0.3 % ophthalmic solution Place 1 drop into the right eye 4 (four) times daily for 5 days. 12/16/20 12/21/20 Yes Lance Muss, FNP  ibuprofen (ADVIL) 800 MG tablet Take 1 tablet (800 mg total) by mouth every 8 (eight) hours as needed. 06/07/20   Shivaji, Valerie Roys, MD     Family History Family History  Problem Relation Age of Onset   Healthy Mother    Cancer Father    Healthy Sister    Healthy Brother    Hypertension Maternal Grandmother     Social History Social History   Tobacco Use   Smoking status: Never   Smokeless tobacco: Never  Vaping Use   Vaping Use: Never used  Substance Use Topics   Alcohol use: Yes    Comment: occasional   Drug use: Never     Allergies   Sulfa antibiotics   Review of Systems Review of Systems Per HPI  Physical Exam Triage Vital Signs ED Triage Vitals  Enc Vitals Group     BP 12/16/20 1419 126/78     Pulse Rate 12/16/20 1419 63     Resp 12/16/20 1419 20     Temp 12/16/20 1419 99 F (37.2 C)     Temp Source 12/16/20 1419 Oral     SpO2 12/16/20 1419 98 %     Weight 12/16/20 1432 140 lb (63.5 kg)     Height 12/16/20 1432 5\' 6"  (1.676 m)     Head Circumference --      Peak Flow --      Pain Score 12/16/20 1431 4     Pain Loc --      Pain Edu? --      Excl. in GC? --  No data found.  Updated Vital Signs BP 126/78 (BP Location: Left Arm)   Pulse 63   Temp 99 F (37.2 C) (Oral)   Resp 20   Ht 5\' 6"  (1.676 m)   Wt 140 lb (63.5 kg)   SpO2 98%   BMI 22.60 kg/m   Visual Acuity Right Eye Distance: 20/40 Left Eye Distance: 20/50 Bilateral Distance: 20/50  Right Eye Near:   Left Eye Near:    Bilateral Near:     Physical Exam Constitutional:      Appearance: Normal appearance.  HENT:     Head: Normocephalic and atraumatic.  Eyes:     General: Lids are everted, no foreign bodies appreciated.        Right eye: No foreign body, discharge or hordeolum.     Extraocular Movements: Extraocular movements intact.     Conjunctiva/sclera:     Right eye: Right conjunctiva is injected. No exudate or hemorrhage.    Comments: Scleral redness present to right eye.  Pulmonary:     Effort: Pulmonary effort is normal.  Neurological:     General: No focal deficit present.     Mental Status:  She is alert and oriented to person, place, and time. Mental status is at baseline.  Psychiatric:        Mood and Affect: Mood normal.        Behavior: Behavior normal.        Thought Content: Thought content normal.        Judgment: Judgment normal.     UC Treatments / Results  Labs (all labs ordered are listed, but only abnormal results are displayed) Labs Reviewed - No data to display  EKG   Radiology No results found.  Procedures Procedures (including critical care time)  Medications Ordered in UC Medications - No data to display  Initial Impression / Assessment and Plan / UC Course  I have reviewed the triage vital signs and the nursing notes.  Pertinent labs & imaging results that were available during my care of the patient were reviewed by me and considered in my medical decision making (see chart for details).     Right eye physical exam is consistent with bacterial conjunctivitis.  Will cover with ofloxacin eyedrops due to patient being a contact lens wearer.  Advised patient to follow-up if blurry vision occurs or if symptoms do not improve.  Visual acuity exam fairly normal today.  No red flags.Discussed strict return precautions. Patient verbalized understanding and is agreeable with plan.  Final Clinical Impressions(s) / UC Diagnoses   Final diagnoses:  Acute bacterial conjunctivitis of right eye     Discharge Instructions      You are being treated with ofloxacin eyedrops to treat right eye infection.  Please follow-up if blurry vision occurs or if symptoms do not improve.     ED Prescriptions     Medication Sig Dispense Auth. Provider   ofloxacin (OCUFLOX) 0.3 % ophthalmic solution Place 1 drop into the right eye 4 (four) times daily for 5 days. 5 mL , FNP      PDMP not reviewed this encounter.   Lance Muss, FNP 12/16/20 [id number]

## 2023-09-20 ENCOUNTER — Encounter (HOSPITAL_COMMUNITY): Payer: Self-pay

## 2023-09-20 ENCOUNTER — Ambulatory Visit (HOSPITAL_COMMUNITY)
Admission: EM | Admit: 2023-09-20 | Discharge: 2023-09-20 | Disposition: A | Discharge status: Home / Self Care | Attending: Emergency Medicine | Admitting: Emergency Medicine

## 2023-09-20 DIAGNOSIS — J029 Acute pharyngitis, unspecified: Secondary | ICD-10-CM

## 2023-09-20 DIAGNOSIS — M791 Myalgia, unspecified site: Secondary | ICD-10-CM | POA: Diagnosis not present

## 2023-09-20 MED ORDER — METHOCARBAMOL 500 MG PO TABS
500.0000 mg | ORAL_TABLET | Freq: Two times a day (BID) | ORAL | 0 refills | Status: AC
Start: 2023-09-20 — End: ?

## 2023-09-20 MED ORDER — PREDNISONE 10 MG (21) PO TBPK: ORAL_TABLET | Freq: Every day | ORAL | 0 refills | Status: AC

## 2023-09-20 NOTE — Discharge Instructions (Addendum)
 Symptoms more viral in nature or may be allergy related does not appear to need antibiotics at this time Can take over-the-counter Claritin daily Take Tylenol  or Motrin  as needed for pain Follow-up with [facility] if symptoms persist after 1 week for further testing Use warm compresses to right thigh this may help sooth the inflammation Rest avoid any lower extremity exercises until pain is better Take muscle relaxer as needed do not drive while taking

## 2023-09-20 NOTE — ED Triage Notes (Signed)
 Patient presents to the office for sore throat and cough x 2 days. Home Intervention: None  Patient reports right hip pain. Patient does heavy lifting at work. Denies any recent injuries.

## 2023-09-20 NOTE — ED Provider Notes (Signed)
 [facility]    CSN: [id number] Arrival date & time: 09/20/23  0900      History   Chief Complaint Chief Complaint  Patient presents with   Sore Throat   Cough    HPI Robin Floyd is a 28 y.o. female.   Patient presents today with a sore throat that started yesterday.  He denies any cough congestion or upper respiratory symptoms.  Denies any known fevers has not taken anything prior to arrival Patient also here for right inner groin thigh muscle pain states that she has been working out with more frequently and in the past week has noticed tenderness to that area.  Denies any injury has not taken anything prior to arrival.    Past Medical History:  Diagnosis Date   Polyp of corpus uteri    Wears contact lenses     Patient Active Problem List   Diagnosis Date Noted   Acute appendicitis 03/09/2016    Past Surgical History:  Procedure Laterality Date   DILATATION & CURETTAGE/HYSTEROSCOPY WITH MYOSURE N/A 06/07/2020   Procedure: DILATATION & CURETTAGE/HYSTEROSCOPY WITH MYOSURE POLYPECTOMY;  Surgeon: Gaspar Karma, MD;  Location: [facility];  Service: Gynecology;  Laterality: N/A;   LAPAROSCOPIC APPENDECTOMY N/A 03/10/2016   Procedure: APPENDECTOMY LAPAROSCOPIC;  Surgeon: Sim Dryer, MD;  Location: [facility];  Service: General;  Laterality: N/A;    OB History   No obstetric history on file.      Home Medications    Prior to Admission medications   Medication Sig Start Date End Date Taking? Authorizing Provider  ibuprofen  (ADVIL ) 800 MG tablet Take 1 tablet (800 mg total) by mouth every 8 (eight) hours as needed. 06/07/20   Shivaji, Martin Slay, MD  levonorgestrel -ethinyl estradiol  (SEASONALE) 0.15-0.03 MG tablet Take 1 tablet by mouth at bedtime.    [provider]    Family History Family History  Problem Relation Age of Onset   Healthy Mother    Cancer Father    Healthy Sister    Healthy Brother    Hypertension Maternal  Grandmother     Social History Social History   Tobacco Use   Smoking status: Never   Smokeless tobacco: Never  Vaping Use   Vaping status: Never Used  Substance Use Topics   Alcohol use: Yes    Comment: occasional   Drug use: Never     Allergies   Sulfa antibiotics   Review of Systems Review of Systems  Constitutional:  Negative for chills and fever.  HENT:  Positive for sore throat. Negative for congestion, ear discharge, ear pain, postnasal drip, rhinorrhea, sinus pressure and sinus pain.   Respiratory: Negative.    Cardiovascular: Negative.   Gastrointestinal: Negative.   Musculoskeletal:        Upright hip muscle pain in the thigh area  Skin: Negative.   Neurological: Negative.      Physical Exam Triage Vital Signs ED Triage Vitals  Encounter Vitals Group     BP 09/20/23 1037 123/76     Systolic BP Percentile --      Diastolic BP Percentile --      Pulse Rate 09/20/23 1037 (!) 105     Resp 09/20/23 1037 18     Temp 09/20/23 1039 99.6 F (37.6 C)     Temp Source 09/20/23 1039 Oral     SpO2 09/20/23 1037 97 %     Weight --      Height --  Head Circumference --      Peak Flow --      Pain Score --      Pain Loc --      Pain Education --      Exclude from Growth Chart --    No data found.  Updated Vital Signs BP 123/76 (BP Location: Left Arm)   Pulse (!) 105   Temp 99.6 F (37.6 C) (Oral)   Resp 18   LMP 08/30/2003 (Approximate)   SpO2 97%   Visual Acuity Right Eye Distance:   Left Eye Distance:   Bilateral Distance:    Right Eye Near:   Left Eye Near:    Bilateral Near:     Physical Exam Constitutional:      Appearance: She is well-developed.  HENT:     Right Ear: Tympanic membrane normal.     Left Ear: Tympanic membrane normal.     Nose: No congestion or rhinorrhea.     Mouth/Throat:     Mouth: No oral lesions.     Pharynx: Posterior oropharyngeal erythema present. No pharyngeal swelling.     Tonsils: No tonsillar exudate  or tonsillar abscesses. 1+ on the right. 1+ on the left.  Cardiovascular:     Rate and Rhythm: Normal rate.     Heart sounds: Normal heart sounds.  Pulmonary:     Effort: Pulmonary effort is normal.     Breath sounds: Normal breath sounds.  Abdominal:     Palpations: Abdomen is soft.  Musculoskeletal:     Cervical back: Normal range of motion.  Skin:    General: Skin is warm.  Neurological:     General: No focal deficit present.     Mental Status: She is alert.      UC Treatments / Results  Labs (all labs ordered are listed, but only abnormal results are displayed) Labs Reviewed - No data to display  EKG   Radiology No results found.  Procedures Procedures (including critical care time)  Medications Ordered in UC Medications - No data to display  Initial Impression / Assessment and Plan / UC Course  I have reviewed the triage vital signs and the nursing notes.  Pertinent labs & imaging results that were available during my care of the patient were reviewed by me and considered in my medical decision making (see chart for details).     Symptoms more viral in nature or may be allergy related does not appear to need antibiotics at this time Can take over-the-counter Claritin daily Take Tylenol  or Motrin  as needed for pain Follow-up with [facility] if symptoms persist after 1 week for further testing Use warm compresses to right thigh this may help sooth the inflammation Rest avoid any lower extremity exercises until pain is better  Final Clinical Impressions(s) / UC Diagnoses   Final diagnoses:  None   Discharge Instructions   None    ED Prescriptions   None    PDMP not reviewed this encounter.   Harden Leyden, NP 09/20/23 1120
